# Patient Record
Sex: Female | Born: 1985 | Race: Black or African American | Hispanic: No | Marital: Single | Smoking: Never smoker
Health system: Southern US, Community
[De-identification: ages and names within clinical notes are randomized; demographics above are authoritative.]

## PROBLEM LIST (undated history)

## (undated) ENCOUNTER — Inpatient Hospital Stay (HOSPITAL_COMMUNITY): Payer: Self-pay

## (undated) DIAGNOSIS — R87629 Unspecified abnormal cytological findings in specimens from vagina: Secondary | ICD-10-CM

## (undated) DIAGNOSIS — O139 Gestational [pregnancy-induced] hypertension without significant proteinuria, unspecified trimester: Secondary | ICD-10-CM

## (undated) DIAGNOSIS — G47419 Narcolepsy without cataplexy: Secondary | ICD-10-CM

## (undated) DIAGNOSIS — I10 Essential (primary) hypertension: Secondary | ICD-10-CM

## (undated) HISTORY — PX: TONSILLECTOMY: SUR1361

## (undated) HISTORY — DX: Essential (primary) hypertension: I10

## (undated) HISTORY — PX: DILATION AND CURETTAGE OF UTERUS: SHX78

---
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
## 2005-02-18 ENCOUNTER — Other Ambulatory Visit: Admission: RE | Admit: 2005-02-18 | Discharge: 2005-02-18 | Payer: Self-pay | Admitting: Obstetrics and Gynecology

## 2005-11-09 ENCOUNTER — Emergency Department (HOSPITAL_COMMUNITY): Admission: EM | Admit: 2005-11-09 | Discharge: 2005-11-09 | Payer: Self-pay | Admitting: Emergency Medicine

## 2006-11-30 ENCOUNTER — Inpatient Hospital Stay (HOSPITAL_COMMUNITY): Admission: AD | Admit: 2006-11-30 | Discharge: 2006-11-30 | Payer: Self-pay | Admitting: Family Medicine

## 2009-09-21 ENCOUNTER — Inpatient Hospital Stay (HOSPITAL_COMMUNITY): Admission: AD | Admit: 2009-09-21 | Discharge: 2009-09-22 | Payer: Self-pay | Admitting: Obstetrics & Gynecology

## 2009-11-13 ENCOUNTER — Ambulatory Visit: Payer: Self-pay | Admitting: Pulmonary Disease

## 2009-11-13 DIAGNOSIS — G4719 Other hypersomnia: Secondary | ICD-10-CM | POA: Insufficient documentation

## 2009-11-21 ENCOUNTER — Telehealth (INDEPENDENT_AMBULATORY_CARE_PROVIDER_SITE_OTHER): Payer: Self-pay | Admitting: *Deleted

## 2010-01-06 ENCOUNTER — Ambulatory Visit (HOSPITAL_BASED_OUTPATIENT_CLINIC_OR_DEPARTMENT_OTHER): Admission: RE | Admit: 2010-01-06 | Discharge: 2010-01-06 | Payer: Self-pay | Admitting: Pulmonary Disease

## 2010-01-06 ENCOUNTER — Encounter: Payer: Self-pay | Admitting: Pulmonary Disease

## 2010-01-07 ENCOUNTER — Encounter: Payer: Self-pay | Admitting: Pulmonary Disease

## 2010-01-16 ENCOUNTER — Ambulatory Visit: Payer: Self-pay | Admitting: Pulmonary Disease

## 2010-01-27 ENCOUNTER — Ambulatory Visit: Payer: Self-pay | Admitting: Pulmonary Disease

## 2010-03-06 ENCOUNTER — Encounter: Payer: Self-pay | Admitting: Pulmonary Disease
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
## 2010-07-01 NOTE — Assessment & Plan Note (Signed)
Summary: ov to review results of npsg/mslt.   Copy to:  self- referral Primary Provider/Referring Provider:  None  CC:  Pt is here for a f/u appt to discuss sleep study results.  Pt denied any complaints/concerns. .  History of Present Illness: The pt comes in today for f/u of her recent NPSG and MSLT as part of a w/u for hypersomnia.  She was found to have no significant sleep disordered breathing, no arrhythmias or leg movements, but did have large numbers of nonspecific arousals.  There was no explanation for her hypersomnia on the basis of her sleep study.  She then underwent a MSLT, where she had a mean sleep onset latency of 10.2 min, and no SOREM's were noted.  She did not bring her sleep diaries to the sleep study.  I have gone over the studies with her in detail, and answered all of her questions.    Current Medications (verified): 1)  Tylenol 325 Mg Tabs (Acetaminophen) .... As Needed 2)  Prenatal Vitamins .... Take 1 Tablet By Mouth Once A Day 3)  Doxycycline Hyclate 100 Mg Caps (Doxycycline Hyclate) .... Take 1 Tablet By Mouth Two Times A Day For Acne 4)  Nuvaring 0.12-0.015 Mg/24hr Ring (Etonogestrel-Ethinyl Estradiol) .... Use As Directed  Allergies (verified): 1)  ! Penicillin  Review of Systems  The patient denies shortness of breath with activity, shortness of breath at rest, productive cough, non-productive cough, coughing up blood, chest pain, irregular heartbeats, acid heartburn, indigestion, loss of appetite, weight change, abdominal pain, difficulty swallowing, sore throat, tooth/dental problems, headaches, nasal congestion/difficulty breathing through nose, sneezing, itching, ear ache, anxiety, depression, hand/feet swelling, joint stiffness or pain, rash, change in color of mucus, and fever.    Vital Signs:  Patient profile:   25 year old female Height:      66 inches Weight:      162.38 pounds BMI:     26.30 O2 Sat:      99 % on Room air Temp:     98.5 degrees  F oral Pulse rate:   75 / minute BP sitting:   128 / 82  (left arm) Cuff size:   regular  Vitals Entered By: Arman Filter LPN (January 27, 2010 11:23 AM)  O2 Flow:  Room air CC: Pt is here for a f/u appt to discuss sleep study results.  Pt denied any complaints/concerns.  Comments Medications reviewed with patient Arman Filter LPN  January 27, 2010 11:26 AM    Physical Exam  General:  wd female in nad Extremities:  no edema or cyanosis  Neurologic:  alert, oriented, moves all 4.   Impression & Recommendations:  Problem # 1:  HYPERSOMNIA (ICD-780.54) the pt was found to have a normal NPSG other than large numbers of nonspecific arousals with no obvious cause, and her MSLT was essentially normal as well.  I really have no explanation for her daytime sleepiness other than to wonder whether this is subjective, and more an issue with fatigue or tiredness than true sleepiness.  Will check her thyroid tests to make sure this isn't an issue, but if ok, I would recommend working on weight loss and good sleep hygiene for the next few months to see if things improve.  The pt is not overly satisfied with this, and would like to consider further w/u.  I have offered to refer to neuro/ sleep specialist to see if there may be another explanation for her symptoms.  Medications Added to Medication  List This Visit: 1)  Nuvaring 0.12-0.015 Mg/24hr Ring (Etonogestrel-ethinyl estradiol) .... Use as directed  Other Orders: Est. Patient Level III (52841) TLB-TSH (Thyroid Stimulating Hormone) (32440-NUU)  Patient Instructions: 1)  will check thyroid tests to see if this may be causing your subjective sleepiness.  If these are normal, would focus on getting adequate sleep, and good sleep hygiene.   2)  Can consider a neurology evaluation if sleepiness continues.

## 2010-07-01 NOTE — Progress Notes (Signed)
Summary: sleep study referral-LMTCB x1  Phone Note Call from Patient Call back at Home Phone [phone]   Caller: Patient Call For: clance Reason for Call: Talk to Nurse Summary of Call: Needs to be referred to an outside lab that does sleep studies on the weekend. Initial call taken by: Darletta Moll,  November 21, 2009 11:35 AM  Follow-up for Phone Call        dr clance pls advise   Philipp Deputy [facility]  November 21, 2009 11:50 AM   Additional Follow-up for Phone Call Additional follow up Details #1::        no outside sleep lab does the mslt (daytime sleep study) on weekends. She may have a complicated sleep problem that will require me having access to all of the data.  I would prefer she have her sleep testing where I can see the results. Additional Follow-up by: Barbaraann Share MD,  November 21, 2009 1:39 PM    Additional Follow-up for Phone Call Additional follow up Details #2::    [facility] Gweneth Dimitri RN  November 21, 2009 2:15 PM  Spoke with pt and advised of the above recs per [facility].  Pt verbalized understanding and states that she will keep her mslt appt that we already set up for her on 7/25 at 8 pm. Follow-up by: Vernie Murders,  November 22, 2009 11:34 AM

## 2010-07-01 NOTE — Letter (Signed)
Summary: [facility]  [facility]   Imported By: Sherian Rein 04/07/2010 09:50:45  _____________________________________________________________________  External Attachment:    Type:   Image     Comment:   External Document

## 2010-08-19 LAB — CBC
HCT: 35.2 % — ABNORMAL LOW (ref 36.0–46.0)
Hemoglobin: 11.7 g/dL — ABNORMAL LOW (ref 12.0–15.0)
MCHC: 33.2 g/dL (ref 30.0–36.0)
RDW: 15.3 % (ref 11.5–15.5)
WBC: 8.7 10*3/uL (ref 4.0–10.5)

## 2011-01-28 ENCOUNTER — Emergency Department (HOSPITAL_COMMUNITY)
Admission: EM | Admit: 2011-01-28 | Discharge: 2011-01-28 | Disposition: A | Discharge status: Home / Self Care | Payer: 59 | Attending: Emergency Medicine | Admitting: Emergency Medicine

## 2011-01-28 ENCOUNTER — Encounter (HOSPITAL_COMMUNITY): Payer: Self-pay

## 2011-01-28 ENCOUNTER — Emergency Department (HOSPITAL_COMMUNITY): Payer: 59

## 2011-01-28 DIAGNOSIS — E860 Dehydration: Secondary | ICD-10-CM | POA: Insufficient documentation

## 2011-01-28 DIAGNOSIS — R197 Diarrhea, unspecified: Secondary | ICD-10-CM | POA: Insufficient documentation

## 2011-01-28 DIAGNOSIS — R630 Anorexia: Secondary | ICD-10-CM | POA: Insufficient documentation

## 2011-01-28 DIAGNOSIS — N898 Other specified noninflammatory disorders of vagina: Secondary | ICD-10-CM | POA: Insufficient documentation

## 2011-01-28 DIAGNOSIS — R112 Nausea with vomiting, unspecified: Secondary | ICD-10-CM | POA: Insufficient documentation

## 2011-01-28 DIAGNOSIS — R109 Unspecified abdominal pain: Secondary | ICD-10-CM | POA: Insufficient documentation

## 2011-01-28 LAB — WET PREP, GENITAL
Clue Cells Wet Prep HPF POC: NONE SEEN
Trich, Wet Prep: NONE SEEN
Yeast Wet Prep HPF POC: NONE SEEN

## 2011-01-28 LAB — URINALYSIS, ROUTINE W REFLEX MICROSCOPIC
Leukocytes, UA: NEGATIVE
Protein, ur: NEGATIVE mg/dL

## 2011-01-28 LAB — DIFFERENTIAL
Basophils Absolute: 0 10*3/uL (ref 0.0–0.1)
Basophils Relative: 0 % (ref 0–1)
Eosinophils Absolute: 0 10*3/uL (ref 0.0–0.7)
Eosinophils Relative: 0 % (ref 0–5)
Monocytes Absolute: 0.4 10*3/uL (ref 0.1–1.0)
Monocytes Relative: 6 % (ref 3–12)
Neutro Abs: 4.4 10*3/uL (ref 1.7–7.7)

## 2011-01-28 LAB — CBC
MCHC: 36.3 g/dL — ABNORMAL HIGH (ref 30.0–36.0)
RBC: 4.39 MIL/uL (ref 3.87–5.11)
RDW: 12.6 % (ref 11.5–15.5)

## 2011-01-28 LAB — POCT I-STAT, CHEM 8
BUN: 6 mg/dL (ref 6–23)
Chloride: 101 mEq/L (ref 96–112)
Glucose, Bld: 85 mg/dL (ref 70–99)
Potassium: 3.6 mEq/L (ref 3.5–5.1)
Sodium: 136 mEq/L (ref 135–145)

## 2011-01-28 LAB — URINE MICROSCOPIC-ADD ON

## 2011-01-28 MED ORDER — IOHEXOL 300 MG/ML  SOLN
100.0000 mL | Freq: Once | INTRAMUSCULAR | Status: AC | PRN
  Administered 2011-01-28: 100 mL via INTRAVENOUS

## 2011-01-29 ENCOUNTER — Emergency Department (HOSPITAL_COMMUNITY)
Admission: EM | Admit: 2011-01-29 | Discharge: 2011-01-30 | Disposition: A | Discharge status: Home / Self Care | Payer: 59 | Attending: Emergency Medicine | Admitting: Emergency Medicine

## 2011-01-29 DIAGNOSIS — R109 Unspecified abdominal pain: Secondary | ICD-10-CM | POA: Insufficient documentation

## 2011-01-29 DIAGNOSIS — R6883 Chills (without fever): Secondary | ICD-10-CM | POA: Insufficient documentation

## 2011-01-29 DIAGNOSIS — R112 Nausea with vomiting, unspecified: Secondary | ICD-10-CM | POA: Insufficient documentation

## 2011-01-29 DIAGNOSIS — R630 Anorexia: Secondary | ICD-10-CM | POA: Insufficient documentation

## 2011-01-29 DIAGNOSIS — R10819 Abdominal tenderness, unspecified site: Secondary | ICD-10-CM | POA: Insufficient documentation

## 2011-01-29 DIAGNOSIS — E860 Dehydration: Secondary | ICD-10-CM | POA: Insufficient documentation

## 2011-01-29 DIAGNOSIS — Z79899 Other long term (current) drug therapy: Secondary | ICD-10-CM | POA: Insufficient documentation

## 2011-01-29 DIAGNOSIS — R5381 Other malaise: Secondary | ICD-10-CM | POA: Insufficient documentation

## 2011-01-29 DIAGNOSIS — R42 Dizziness and giddiness: Secondary | ICD-10-CM | POA: Insufficient documentation

## 2011-01-29 LAB — GC/CHLAMYDIA PROBE AMP, GENITAL
Chlamydia, DNA Probe: NEGATIVE
GC Probe Amp, Genital: NEGATIVE

## 2011-01-29 LAB — URINE CULTURE
Colony Count: 2000
Culture  Setup Time: 201208291415

## 2011-01-30 ENCOUNTER — Emergency Department (HOSPITAL_COMMUNITY): Payer: 59

## 2011-01-30 LAB — URINALYSIS, ROUTINE W REFLEX MICROSCOPIC
Glucose, UA: NEGATIVE mg/dL
Hgb urine dipstick: NEGATIVE
Ketones, ur: >80 mg/dL — AB
Leukocytes, UA: NEGATIVE
Nitrite: NEGATIVE
Protein, ur: NEGATIVE mg/dL
Specific Gravity, Urine: 1.024 (ref 1.005–1.030)
Urobilinogen, UA: 1 mg/dL (ref 0.0–1.0)
pH: 6.5 (ref 5.0–8.0)

## 2011-01-30 LAB — CBC
HCT: 39.2 % (ref 36.0–46.0)
Hemoglobin: 13.9 g/dL (ref 12.0–15.0)
MCH: 31.2 pg (ref 26.0–34.0)
MCHC: 35.5 g/dL (ref 30.0–36.0)
MCV: 88.1 fL (ref 78.0–100.0)
Platelets: 227 10*3/uL (ref 150–400)
RBC: 4.45 MIL/uL (ref 3.87–5.11)
RDW: 12.9 % (ref 11.5–15.5)
WBC: 6.8 10*3/uL (ref 4.0–10.5)

## 2011-01-30 LAB — DIFFERENTIAL
Basophils Absolute: 0 10*3/uL (ref 0.0–0.1)
Basophils Relative: 0 % (ref 0–1)
Eosinophils Absolute: 0 10*3/uL (ref 0.0–0.7)
Eosinophils Relative: 0 % (ref 0–5)
Lymphocytes Relative: 30 % (ref 12–46)
Lymphs Abs: 2 10*3/uL (ref 0.7–4.0)
Monocytes Absolute: 0.5 10*3/uL (ref 0.1–1.0)
Monocytes Relative: 7 % (ref 3–12)
Neutro Abs: 4.3 10*3/uL (ref 1.7–7.7)
Neutrophils Relative %: 62 % (ref 43–77)

## 2011-01-30 LAB — COMPREHENSIVE METABOLIC PANEL
ALT: 12 U/L (ref 0–35)
AST: 25 U/L (ref 0–37)
Albumin: 4 g/dL (ref 3.5–5.2)
Alkaline Phosphatase: 78 U/L (ref 39–117)
BUN: 7 mg/dL (ref 6–23)
CO2: 24 mEq/L (ref 19–32)
Calcium: 8.6 mg/dL (ref 8.4–10.5)
Chloride: 102 mEq/L (ref 96–112)
Creatinine, Ser: 0.77 mg/dL (ref 0.50–1.10)
GFR calc Af Amer: >60 mL/min (ref >60)
GFR calc non Af Amer: >60 mL/min (ref >60)
Glucose, Bld: 88 mg/dL (ref 70–99)
Potassium: 3.9 mEq/L (ref 3.5–5.1)
Sodium: 138 mEq/L (ref 135–145)
Total Bilirubin: 0.4 mg/dL (ref 0.3–1.2)
Total Protein: 7.1 g/dL (ref 6.0–8.3)

## 2011-01-30 LAB — LIPASE, BLOOD: Lipase: 17 U/L (ref 11–59)

## 2011-03-17 LAB — CBC
Hemoglobin: 12.5
MCHC: 33.7
MCV: 89.3
RBC: 4.15
WBC: 7.1

## 2011-03-17 LAB — URINALYSIS, ROUTINE W REFLEX MICROSCOPIC
Leukocytes, UA: NEGATIVE
Nitrite: NEGATIVE
Protein, ur: 30 — AB
Specific Gravity, Urine: >1.03 — ABNORMAL HIGH
Urobilinogen, UA: 1

## 2011-03-17 LAB — HCG, QUANTITATIVE, PREGNANCY: hCG, Beta Chain, Quant, S: 26026 — ABNORMAL HIGH

## 2011-03-17 LAB — WET PREP, GENITAL
Clue Cells Wet Prep HPF POC: NONE SEEN
Trich, Wet Prep: NONE SEEN
Yeast Wet Prep HPF POC: NONE SEEN

## 2011-03-17 LAB — URINE MICROSCOPIC-ADD ON

## 2011-08-26 ENCOUNTER — Emergency Department (HOSPITAL_COMMUNITY)
Admission: EM | Admit: 2011-08-26 | Discharge: 2011-08-26 | Disposition: A | Discharge status: Home / Self Care | Payer: 59 | Attending: Emergency Medicine | Admitting: Emergency Medicine

## 2011-08-26 ENCOUNTER — Emergency Department (HOSPITAL_COMMUNITY): Payer: 59

## 2011-08-26 ENCOUNTER — Encounter (HOSPITAL_COMMUNITY): Payer: Self-pay

## 2011-08-26 DIAGNOSIS — Y92410 Unspecified street and highway as the place of occurrence of the external cause: Secondary | ICD-10-CM | POA: Insufficient documentation

## 2011-08-26 DIAGNOSIS — S161XXA Strain of muscle, fascia and tendon at neck level, initial encounter: Secondary | ICD-10-CM

## 2011-08-26 DIAGNOSIS — S139XXA Sprain of joints and ligaments of unspecified parts of neck, initial encounter: Secondary | ICD-10-CM | POA: Insufficient documentation

## 2011-08-26 MED ORDER — HYDROCODONE-ACETAMINOPHEN 5-500 MG PO TABS: 1.0000 | ORAL_TABLET | Freq: Four times a day (QID) | ORAL | Status: AC | PRN

## 2011-08-26 MED ORDER — CYCLOBENZAPRINE HCL 10 MG PO TABS: 10.0000 mg | ORAL_TABLET | Freq: Two times a day (BID) | ORAL | Status: AC | PRN

## 2011-08-26 MED ORDER — ACETAMINOPHEN 325 MG PO TABS
650.0000 mg | ORAL_TABLET | Freq: Once | ORAL | Status: AC
  Administered 2011-08-26: 650 mg via ORAL
  Filled 2011-08-26: qty 2

## 2011-08-26 MED ORDER — IBUPROFEN 600 MG PO TABS: 600.0000 mg | ORAL_TABLET | Freq: Four times a day (QID) | ORAL | Status: AC | PRN

## 2011-08-26 NOTE — ED Provider Notes (Signed)
Medical screening examination/treatment/procedure(s) were performed by non-physician practitioner and as supervising physician I was immediately available for consultation/collaboration.   Lyanne Co, MD 08/26/11 [phone]
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
## 2011-08-26 NOTE — ED Notes (Signed)
Assist PA with holding C-spine
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
## 2012-05-09 ENCOUNTER — Inpatient Hospital Stay (HOSPITAL_COMMUNITY): Payer: 59

## 2012-05-09 ENCOUNTER — Inpatient Hospital Stay (HOSPITAL_COMMUNITY)
Admission: AD | Admit: 2012-05-09 | Discharge: 2012-05-09 | Disposition: A | Discharge status: Home / Self Care | Payer: 59 | Source: Ambulatory Visit | Attending: Obstetrics and Gynecology | Admitting: Obstetrics and Gynecology

## 2012-05-09 ENCOUNTER — Encounter (HOSPITAL_COMMUNITY): Payer: Self-pay | Admitting: *Deleted

## 2012-05-09 DIAGNOSIS — O200 Threatened abortion: Secondary | ICD-10-CM

## 2012-05-09 DIAGNOSIS — O26859 Spotting complicating pregnancy, unspecified trimester: Secondary | ICD-10-CM | POA: Insufficient documentation

## 2012-05-09 DIAGNOSIS — R109 Unspecified abdominal pain: Secondary | ICD-10-CM | POA: Insufficient documentation

## 2012-05-09 DIAGNOSIS — O210 Mild hyperemesis gravidarum: Secondary | ICD-10-CM | POA: Insufficient documentation

## 2012-05-09 LAB — CBC WITH DIFFERENTIAL/PLATELET
Basophils Absolute: 0 10*3/uL (ref 0.0–0.1)
Lymphocytes Relative: 43 % (ref 12–46)
Lymphs Abs: 3.2 10*3/uL (ref 0.7–4.0)
Neutro Abs: 3.5 10*3/uL (ref 1.7–7.7)
Platelets: 202 10*3/uL (ref 150–400)
RBC: 4.36 MIL/uL (ref 3.87–5.11)
RDW: 12.6 % (ref 11.5–15.5)
WBC: 7.4 10*3/uL (ref 4.0–10.5)

## 2012-05-09 LAB — URINE MICROSCOPIC-ADD ON

## 2012-05-09 LAB — URINALYSIS, ROUTINE W REFLEX MICROSCOPIC
Glucose, UA: NEGATIVE mg/dL
Ketones, ur: 15 mg/dL — AB
Leukocytes, UA: NEGATIVE
Protein, ur: NEGATIVE mg/dL
Urobilinogen, UA: 0.2 mg/dL (ref 0.0–1.0)

## 2012-05-09 LAB — WET PREP, GENITAL
Trich, Wet Prep: NONE SEEN
Yeast Wet Prep HPF POC: NONE SEEN

## 2012-05-09 LAB — HCG, QUANTITATIVE, PREGNANCY: hCG, Beta Chain, Quant, S: 27041 m[IU]/mL — ABNORMAL HIGH (ref <5)

## 2012-05-09 MED ORDER — ONDANSETRON 8 MG PO TBDP
8.0000 mg | ORAL_TABLET | Freq: Once | ORAL | Status: AC
  Administered 2012-05-09: 8 mg via ORAL
  Filled 2012-05-09: qty 1

## 2012-05-09 MED ORDER — IBUPROFEN 600 MG PO TABS
600.0000 mg | ORAL_TABLET | Freq: Once | ORAL | Status: AC
  Administered 2012-05-09: 600 mg via ORAL
  Filled 2012-05-09: qty 1

## 2012-05-09 MED ORDER — ONDANSETRON HCL 4 MG PO TABS: 8.0000 mg | ORAL_TABLET | Freq: Two times a day (BID) | ORAL | Status: DC | PRN

## 2012-05-09 NOTE — MAU Note (Signed)
Pt reports she has been having lower abd pain x 4 days, worsening. Mostly middle and left side. Positive preg test at dermatologist office due to medication regimen. LMP 04/02/2012, some blood on tissue today

## 2012-05-09 NOTE — Progress Notes (Signed)
Kerrie Buffalo NP in earlier to discuss lab and u/s results and to discuss plan of care. Written and verbal d/c instructions given and understanding voiced.

## 2012-05-09 NOTE — Progress Notes (Signed)
Only sees spotting when wipes

## 2012-05-09 NOTE — MAU Provider Note (Signed)
History     CSN: [id number]  Arrival date and time: 05/09/12 1947   First Provider Initiated Contact with Patient 05/09/12 2125      Chief Complaint  Patient presents with  . Abdominal Pain   HPI Rhonda Bird is a 26 y.o. female early pregnant who presents to MAU with abdominal pain. The pain started 3 days ago as lower abdominal cramping. Today the pain increased on the left side and she noted spotting. She describes the pain as constant in the lower left abdomen.   OB History    Grav Para Term Preterm Abortions TAB SAB Ect Mult Living   4 1 1  2 2           No past medical history on file.  Past Surgical History  Procedure Date  . Tonsillectomy   . Dilation and curettage of uterus     Family History  Problem Relation Age of Onset  . Other Neg Hx     History  Substance Use Topics  . Smoking status: Former Games developer  . Smokeless tobacco: Not on file  . Alcohol Use: No     Comment: socially    Allergies:  Allergies  Allergen Reactions  . Penicillins Hives    Prescriptions prior to admission  Medication Sig Dispense Refill  . acetaminophen (TYLENOL) 500 MG tablet Take 1,000 mg by mouth every 6 (six) hours as needed. pain      . [DISCONTINUED] ISOtretinoin (ACCUTANE) 30 MG capsule Take 60 mg by mouth daily. Pt d/c'd this Rx        Review of Systems  Constitutional: Negative for fever and chills.  Eyes: Negative for blurred vision and double vision.  Respiratory: Negative for cough and wheezing.   Cardiovascular: Negative for chest pain and palpitations.  Gastrointestinal: Positive for nausea and abdominal pain.  Genitourinary: Positive for frequency. Negative for dysuria and urgency.       Vaginal bleeding, spotting.  Musculoskeletal: Positive for back pain.  Skin: Negative for rash.  Neurological: Negative for dizziness, seizures and headaches.  Endo/Heme/Allergies: Does not bruise/bleed easily.  Psychiatric/Behavioral: Negative for depression.  The patient is not nervous/anxious.   Blood pressure 127/79, pulse 68, temperature 98.5 F (36.9 C), temperature source Oral, resp. rate 20, height 5\' 6"  (1.676 m), weight 155 lb (70.308 kg), last menstrual period 04/02/2012, SpO2 100.00%.  Physical Exam  Nursing note and vitals reviewed. Constitutional: She is oriented to person, place, and time. She appears well-developed and well-nourished. No distress.  HENT:  Head: Normocephalic and atraumatic.  Eyes: EOM are normal.  Neck: Neck supple.  Cardiovascular: Normal rate.   Respiratory: Effort normal.  GI: Soft. There is tenderness in the left lower quadrant. There is no rigidity, no rebound, no guarding and no CVA tenderness.  Genitourinary:       External genitalia without lesions. Scant blood vaginal vault. Cervix closed, long. Left adnexal tenderness. Uterus slightly enlarged.  Musculoskeletal: Normal range of motion.  Neurological: She is alert and oriented to person, place, and time.  Skin: Skin is warm and dry.  Psychiatric: She has a normal mood and affect. Her behavior is normal. Judgment and thought content normal.   Results for orders placed during the hospital encounter of 05/09/12 (from the past 24 hour(s))  URINALYSIS, ROUTINE W REFLEX MICROSCOPIC     Status: Abnormal   Collection Time   05/09/12  8:18 PM      Component Value Range   Color, Urine YELLOW  YELLOW  APPearance CLEAR  CLEAR   Specific Gravity, Urine 1.020  1.005 - 1.030   pH 6.0  5.0 - 8.0   Glucose, UA NEGATIVE  NEGATIVE mg/dL   Hgb urine dipstick SMALL (*) NEGATIVE   Bilirubin Urine NEGATIVE  NEGATIVE   Ketones, ur 15 (*) NEGATIVE mg/dL   Protein, ur NEGATIVE  NEGATIVE mg/dL   Urobilinogen, UA 0.2  0.0 - 1.0 mg/dL   Nitrite NEGATIVE  NEGATIVE   Leukocytes, UA NEGATIVE  NEGATIVE  URINE MICROSCOPIC-ADD ON     Status: Abnormal   Collection Time   05/09/12  8:18 PM      Component Value Range   Squamous Epithelial / LPF FEW (*) RARE   WBC, UA 0-2  <3  WBC/hpf   RBC / HPF 0-2  <3 RBC/hpf   Bacteria, UA RARE  RARE   Urine-Other MUCOUS PRESENT    POCT PREGNANCY, URINE     Status: Abnormal   Collection Time   05/09/12  8:24 PM      Component Value Range   Preg Test, Ur POSITIVE (*) NEGATIVE  CBC WITH DIFFERENTIAL     Status: Normal   Collection Time   05/09/12  8:33 PM      Component Value Range   WBC 7.4  4.0 - 10.5 K/uL   RBC 4.36  3.87 - 5.11 MIL/uL   Hemoglobin 13.5  12.0 - 15.0 g/dL   HCT 16.1  09.6 - 04.5 %   MCV 88.1  78.0 - 100.0 fL   MCH 31.0  26.0 - 34.0 pg   MCHC 35.2  30.0 - 36.0 g/dL   RDW 40.9  81.1 - 91.4 %   Platelets 202  150 - 400 K/uL   Neutrophils Relative 48  43 - 77 %   Neutro Abs 3.5  1.7 - 7.7 K/uL   Lymphocytes Relative 43  12 - 46 %   Lymphs Abs 3.2  0.7 - 4.0 K/uL   Monocytes Relative 8  3 - 12 %   Monocytes Absolute 0.6  0.1 - 1.0 K/uL   Eosinophils Relative 0  0 - 5 %   Eosinophils Absolute 0.0  0.0 - 0.7 K/uL   Basophils Relative 0  0 - 1 %   Basophils Absolute 0.0  0.0 - 0.1 K/uL  HCG, QUANTITATIVE, PREGNANCY     Status: Abnormal   Collection Time   05/09/12  8:33 PM      Component Value Range   hCG, Beta Chain, [place] Longest 270[address] (*) <5 mIU/mL  ABO/RH     Status: Normal (Preliminary result)   Collection Time   05/09/12  8:33 PM      Component Value Range   ABO/RH(D) B POS    WET PREP, GENITAL     Status: Abnormal   Collection Time   05/09/12  9:23 PM      Component Value Range   Yeast Wet Prep HPF POC NONE SEEN  NONE SEEN   Trich, Wet Prep NONE SEEN  NONE SEEN   Clue Cells Wet Prep HPF POC FEW (*) NONE SEEN   WBC, Wet Prep HPF POC FEW (*) NONE SEEN   US Ob Comp Less 14 Wks  05/09/2012  *RADIOLOGY REPORT*  Clinical Data: Left lower quadrant pain and spotting.  Estimated gestational age by LMP is 5 weeks 2 days.  Quantitative beta HCG 27,041  OBSTETRIC <14 WK ULTRASOUND  Technique:  Transabdominal ultrasound was performed for evaluation of the gestation  as well as the maternal uterus and adnexal  regions.  Comparison:  None.  Intrauterine gestational sac: There is a single intrauterine gestation. A small crescent of low attenuation adjacent to the gestational sac suggesting a tiny subchorionic hemorrhage. Yolk sac: Yolk sac is visualized. Embryo: Fetal pole is visualized. Cardiac Activity: Fetal cardiac activity is visualized. Heart Rate: 60 bpm  CRL:  3.1 mm  6 w  0 d        [place] EDC: 01/02/2013  Maternal uterus/Adnexae: The uterus is anteverted.  No myometrial masses are demonstrated. Both ovaries are visualized and appear symmetrical.  No abnormal adnexal masses.  No free pelvic fluid collections identified.  IMPRESSION: Single intrauterine gestational sac.  Estimated gestational age by crown-rump length is 6 weeks 0 days.  Possible minimal subchorionic hemorrhage.   Original Report Authenticated By: Burman Nieves, M.D.    Assessment: 26 y.o. female @ [redacted] weeks gestation with abdominal pain and spotting   Tiny SCH   Threatened AB, concern re: heart rate of 60 bpm   Nausea in early pregnancy  Plan:  Discussed with Dr. Marcelle Overlie    Rx Zofran   Discussed with patient use of ibuprofen in early pregnancy but limit for a couple days   Follow up in the office, will plan to repeat ultrasound in one week   Return here as needed Procedures I have reviewed this patient's vital signs, nurses notes, appropriate labs and imaging. I have discussed results with the patient and she voices understanding.    Medication List     As of 05/09/2012 10:46 PM    START taking these medications         ondansetron 4 MG tablet   Commonly known as: ZOFRAN   Take 2 tablets (8 mg total) by mouth every 12 (twelve) hours as needed for nausea.      CONTINUE taking these medications         acetaminophen 500 MG tablet   Commonly known as: TYLENOL      STOP taking these medications         ISOtretinoin 30 MG capsule   Commonly known as: ACCUTANE          Where to get your medications    These are the  prescriptions that you need to pick up. We sent them to a specific pharmacy, so you will need to go there to get them.   [facility] - [place], [place] - [address]    [address] [place] [address]    Phone: [phone]        ondansetron 4 MG tablet            Deckard Stuber, RN, FNP, [facility] 05/09/2012, 10:45 PM

## 2012-05-10 LAB — ABO/RH: ABO/RH(D): B POS

## 2012-11-22 LAB — OB RESULTS CONSOLE RPR: RPR: NONREACTIVE

## 2012-11-22 LAB — OB RESULTS CONSOLE ANTIBODY SCREEN: Antibody Screen: NEGATIVE

## 2012-11-22 LAB — OB RESULTS CONSOLE HIV ANTIBODY (ROUTINE TESTING): HIV: NONREACTIVE

## 2012-11-22 LAB — OB RESULTS CONSOLE HEPATITIS B SURFACE ANTIGEN: HEP B S AG: NEGATIVE

## 2012-11-22 LAB — OB RESULTS CONSOLE RUBELLA ANTIBODY, IGM: Rubella: IMMUNE

## 2012-12-06 LAB — OB RESULTS CONSOLE GC/CHLAMYDIA
Chlamydia: NEGATIVE
Gonorrhea: NEGATIVE

## 2013-03-14 ENCOUNTER — Encounter (HOSPITAL_COMMUNITY): Payer: Self-pay | Admitting: *Deleted

## 2013-05-07 ENCOUNTER — Inpatient Hospital Stay (HOSPITAL_COMMUNITY)
Admission: EM | Admit: 2013-05-07 | Discharge: 2013-05-09 | DRG: 778 | Disposition: A | Discharge status: Home / Self Care | Payer: 59 | Attending: Obstetrics and Gynecology | Admitting: Obstetrics and Gynecology

## 2013-05-07 ENCOUNTER — Encounter (HOSPITAL_COMMUNITY): Payer: Self-pay | Admitting: Emergency Medicine

## 2013-05-07 DIAGNOSIS — M5380 Other specified dorsopathies, site unspecified: Secondary | ICD-10-CM | POA: Diagnosis present

## 2013-05-07 DIAGNOSIS — G47419 Narcolepsy without cataplexy: Secondary | ICD-10-CM | POA: Diagnosis present

## 2013-05-07 DIAGNOSIS — O4703 False labor before 37 completed weeks of gestation, third trimester: Secondary | ICD-10-CM

## 2013-05-07 DIAGNOSIS — M545 Low back pain, unspecified: Secondary | ICD-10-CM | POA: Diagnosis present

## 2013-05-07 DIAGNOSIS — Y92410 Unspecified street and highway as the place of occurrence of the external cause: Secondary | ICD-10-CM

## 2013-05-07 DIAGNOSIS — O4700 False labor before 37 completed weeks of gestation, unspecified trimester: Principal | ICD-10-CM | POA: Diagnosis present

## 2013-05-07 DIAGNOSIS — O99891 Other specified diseases and conditions complicating pregnancy: Secondary | ICD-10-CM | POA: Diagnosis present

## 2013-05-07 DIAGNOSIS — O479 False labor, unspecified: Secondary | ICD-10-CM | POA: Diagnosis present

## 2013-05-07 LAB — BASIC METABOLIC PANEL
BUN: 5 mg/dL — ABNORMAL LOW (ref 6–23)
CO2: 21 mEq/L (ref 19–32)
Calcium: 8.9 mg/dL (ref 8.4–10.5)
Creatinine, Ser: 0.53 mg/dL (ref 0.50–1.10)
GFR calc non Af Amer: >90 mL/min (ref >90)
Glucose, Bld: 92 mg/dL (ref 70–99)
Sodium: 133 mEq/L — ABNORMAL LOW (ref 135–145)

## 2013-05-07 LAB — CBC WITH DIFFERENTIAL/PLATELET
Basophils Absolute: 0 10*3/uL (ref 0.0–0.1)
Eosinophils Absolute: 0.1 10*3/uL (ref 0.0–0.7)
Eosinophils Relative: 1 % (ref 0–5)
HCT: 35.3 % — ABNORMAL LOW (ref 36.0–46.0)
Lymphocytes Relative: 27 % (ref 12–46)
Lymphs Abs: 2.9 10*3/uL (ref 0.7–4.0)
MCH: 31.9 pg (ref 26.0–34.0)
MCHC: 35.7 g/dL (ref 30.0–36.0)
MCV: 89.4 fL (ref 78.0–100.0)
Monocytes Absolute: 0.8 10*3/uL (ref 0.1–1.0)
Platelets: 226 10*3/uL (ref 150–400)
RBC: 3.95 MIL/uL (ref 3.87–5.11)
RDW: 13 % (ref 11.5–15.5)
WBC: 10.7 10*3/uL — ABNORMAL HIGH (ref 4.0–10.5)

## 2013-05-07 MED ORDER — LACTATED RINGERS IV BOLUS (SEPSIS)
1000.0000 mL | Freq: Once | INTRAVENOUS | Status: AC
  Administered 2013-05-07: 1000 mL via INTRAVENOUS
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
## 2013-05-07 NOTE — ED Notes (Signed)
No changes, OB RRT RN at [facility], pt remains on monitor and OB/fetal monitor, rates abd pain 6/10. VSS.
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
## 2013-05-07 NOTE — ED Notes (Signed)
No changes, preparing for EPIC downtime.
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
## 2013-05-07 NOTE — ED Notes (Signed)
OB Rapid Response RN at [facility], visitor at [facility]. Pt alert, NAD, calm, interactive, skin W&D, resps e/u, speaking in clear complete sentences.

## 2013-05-08 ENCOUNTER — Inpatient Hospital Stay (HOSPITAL_COMMUNITY): Payer: 59

## 2013-05-08 ENCOUNTER — Encounter (HOSPITAL_COMMUNITY): Payer: Self-pay | Admitting: *Deleted

## 2013-05-08 DIAGNOSIS — O479 False labor, unspecified: Secondary | ICD-10-CM | POA: Diagnosis present

## 2013-05-08 DIAGNOSIS — O99891 Other specified diseases and conditions complicating pregnancy: Secondary | ICD-10-CM | POA: Diagnosis present

## 2013-05-08 DIAGNOSIS — M545 Low back pain, unspecified: Secondary | ICD-10-CM | POA: Diagnosis present

## 2013-05-08 DIAGNOSIS — G47419 Narcolepsy without cataplexy: Secondary | ICD-10-CM | POA: Diagnosis present

## 2013-05-08 DIAGNOSIS — O4700 False labor before 37 completed weeks of gestation, unspecified trimester: Secondary | ICD-10-CM | POA: Diagnosis present

## 2013-05-08 DIAGNOSIS — M5380 Other specified dorsopathies, site unspecified: Secondary | ICD-10-CM | POA: Diagnosis present

## 2013-05-08 DIAGNOSIS — Y92410 Unspecified street and highway as the place of occurrence of the external cause: Secondary | ICD-10-CM | POA: Diagnosis not present

## 2013-05-08 LAB — FIBRINOGEN: Fibrinogen: 547 mg/dL — ABNORMAL HIGH (ref 204–475)

## 2013-05-08 LAB — CBC WITH DIFFERENTIAL/PLATELET
Basophils Absolute: 0 10*3/uL (ref 0.0–0.1)
HCT: 32.8 % — ABNORMAL LOW (ref 36.0–46.0)
Hemoglobin: 11.5 g/dL — ABNORMAL LOW (ref 12.0–15.0)
Lymphocytes Relative: 16 % (ref 12–46)
Lymphs Abs: 1.5 10*3/uL (ref 0.7–4.0)
MCV: 88.9 fL (ref 78.0–100.0)
Monocytes Absolute: 0.3 10*3/uL (ref 0.1–1.0)
Neutro Abs: 7.3 10*3/uL (ref 1.7–7.7)
Neutrophils Relative %: 81 % — ABNORMAL HIGH (ref 43–77)
Platelets: 199 10*3/uL (ref 150–400)
RDW: 13.2 % (ref 11.5–15.5)

## 2013-05-08 LAB — CBC
HCT: 30.6 % — ABNORMAL LOW (ref 36.0–46.0)
Hemoglobin: 10.9 g/dL — ABNORMAL LOW (ref 12.0–15.0)
MCH: 31.6 pg (ref 26.0–34.0)
MCHC: 35.6 g/dL (ref 30.0–36.0)
MCV: 88.7 fL (ref 78.0–100.0)
RDW: 13.2 % (ref 11.5–15.5)

## 2013-05-08 MED ORDER — LACTATED RINGERS IV SOLN
INTRAVENOUS | Status: DC
  Administered 2013-05-08: 04:00:00 via INTRAVENOUS

## 2013-05-08 MED ORDER — HYDROCODONE-ACETAMINOPHEN 5-325 MG PO TABS: 1.0000 | ORAL_TABLET | Freq: Four times a day (QID) | ORAL | Status: DC | PRN

## 2013-05-08 MED ORDER — ACETAMINOPHEN 325 MG PO TABS: 650.0000 mg | ORAL_TABLET | Freq: Three times a day (TID) | ORAL | Status: DC | PRN

## 2013-05-08 MED ORDER — BETAMETHASONE SOD PHOS & ACET 6 (3-3) MG/ML IJ SUSP
12.0000 mg | INTRAMUSCULAR | Status: AC
  Administered 2013-05-08 – 2013-05-09 (×2): 12 mg via INTRAMUSCULAR
  Filled 2013-05-08 (×2): qty 2
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
## 2013-05-08 NOTE — ED Notes (Signed)
carelink here at [facility] speaking with pt, receiving report from KLF, RN, no changes, visitor remains, VSS, present in room.
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
## 2013-05-08 NOTE — ED Notes (Addendum)
Pt alert, NAD, calm, interactive, resps e/u, speaking in clear complete sentences, pending arrival of [facility] for Transfer, pt signed EMTALA transfer consent form. Pt denies sx other than low back pain, (denies: nausea, HA, sob, dizziness or other sx), remains on cardiac and fetal monitor, OB RRT RN present.
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
## 2013-05-08 NOTE — Progress Notes (Signed)
Ur chart review completed.  
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
## 2013-05-09 MED ORDER — CYCLOBENZAPRINE HCL 10 MG PO TABS: 10.0000 mg | ORAL_TABLET | Freq: Three times a day (TID) | ORAL | Status: DC | PRN
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
## 2013-05-09 NOTE — Progress Notes (Signed)
Dc instructions reviewed instructed pt to call md if bleeding or leaking of fluid or any concerns for pregnancy or come back to hospital
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
## 2013-06-19 ENCOUNTER — Inpatient Hospital Stay (HOSPITAL_COMMUNITY)
Admission: AD | Admit: 2013-06-19 | Discharge: 2013-06-21 | DRG: 775 | Disposition: A | Discharge status: Home / Self Care | Payer: 59 | Source: Ambulatory Visit | Attending: Obstetrics & Gynecology | Admitting: Obstetrics & Gynecology

## 2013-06-19 ENCOUNTER — Encounter (HOSPITAL_COMMUNITY): Payer: 59 | Admitting: Anesthesiology

## 2013-06-19 ENCOUNTER — Encounter (HOSPITAL_COMMUNITY): Payer: Self-pay | Admitting: *Deleted

## 2013-06-19 ENCOUNTER — Inpatient Hospital Stay (HOSPITAL_COMMUNITY): Payer: 59 | Admitting: Anesthesiology

## 2013-06-19 DIAGNOSIS — G47419 Narcolepsy without cataplexy: Secondary | ICD-10-CM | POA: Diagnosis present

## 2013-06-19 DIAGNOSIS — O99892 Other specified diseases and conditions complicating childbirth: Secondary | ICD-10-CM | POA: Diagnosis present

## 2013-06-19 DIAGNOSIS — Z3490 Encounter for supervision of normal pregnancy, unspecified, unspecified trimester: Secondary | ICD-10-CM

## 2013-06-19 DIAGNOSIS — O4290 Premature rupture of membranes, unspecified as to length of time between rupture and onset of labor, unspecified weeks of gestation: Principal | ICD-10-CM | POA: Diagnosis present

## 2013-06-19 DIAGNOSIS — O9989 Other specified diseases and conditions complicating pregnancy, childbirth and the puerperium: Secondary | ICD-10-CM

## 2013-06-19 HISTORY — DX: Unspecified abnormal cytological findings in specimens from vagina: R87.629

## 2013-06-19 HISTORY — DX: Gestational (pregnancy-induced) hypertension without significant proteinuria, unspecified trimester: O13.9

## 2013-06-19 HISTORY — DX: Narcolepsy without cataplexy: G47.419

## 2013-06-19 LAB — CBC
HCT: 32.7 % — ABNORMAL LOW (ref 36.0–46.0)
HEMOGLOBIN: 11.5 g/dL — AB (ref 12.0–15.0)
MCH: 31 pg (ref 26.0–34.0)
MCHC: 35.2 g/dL (ref 30.0–36.0)
MCV: 88.1 fL (ref 78.0–100.0)
Platelets: 207 10*3/uL (ref 150–400)
RBC: 3.71 MIL/uL — ABNORMAL LOW (ref 3.87–5.11)
RDW: 13.7 % (ref 11.5–15.5)
WBC: 8.3 10*3/uL (ref 4.0–10.5)

## 2013-06-19 LAB — OB RESULTS CONSOLE GBS: GBS: NEGATIVE

## 2013-06-19 LAB — POCT FERN TEST: POCT Fern Test: NEGATIVE

## 2013-06-19 LAB — RPR: RPR Ser Ql: NONREACTIVE

## 2013-06-19 LAB — AMNISURE RUPTURE OF MEMBRANE (ROM) NOT AT ARMC: AMNISURE: POSITIVE

## 2013-06-19 MED ORDER — FLEET ENEMA 7-19 GM/118ML RE ENEM: 1.0000 | ENEMA | RECTAL | Status: DC | PRN

## 2013-06-19 MED ORDER — OXYTOCIN 40 UNITS IN LACTATED RINGERS INFUSION - SIMPLE MED
1.0000 m[IU]/min | INTRAVENOUS | Status: DC
  Administered 2013-06-19: 4 m[IU]/min via INTRAVENOUS
  Administered 2013-06-19: 2 m[IU]/min via INTRAVENOUS
  Administered 2013-06-19: 8 m[IU]/min via INTRAVENOUS
  Filled 2013-06-19: qty 1000

## 2013-06-19 MED ORDER — OXYTOCIN BOLUS FROM INFUSION
500.0000 mL | INTRAVENOUS | Status: DC
  Administered 2013-06-19: 500 mL via INTRAVENOUS

## 2013-06-19 MED ORDER — OXYTOCIN 40 UNITS IN LACTATED RINGERS INFUSION - SIMPLE MED: 62.5000 mL/h | INTRAVENOUS | Status: DC

## 2013-06-19 MED ORDER — FENTANYL 2.5 MCG/ML BUPIVACAINE 1/10 % EPIDURAL INFUSION (WH - ANES)
14.0000 mL/h | INTRAMUSCULAR | Status: DC | PRN
  Administered 2013-06-19: 14 mL/h via EPIDURAL
  Filled 2013-06-19: qty 125

## 2013-06-19 MED ORDER — BUPIVACAINE HCL (PF) 0.25 % IJ SOLN
INTRAMUSCULAR | Status: DC | PRN
  Administered 2013-06-19 (×2): 5 mL via EPIDURAL

## 2013-06-19 MED ORDER — TERBUTALINE SULFATE 1 MG/ML IJ SOLN: 0.2500 mg | Freq: Once | INTRAMUSCULAR | Status: AC | PRN

## 2013-06-19 MED ORDER — EPHEDRINE 5 MG/ML INJ
10.0000 mg | INTRAVENOUS | Status: DC | PRN
  Filled 2013-06-19: qty 2
  Filled 2013-06-19: qty 4

## 2013-06-19 MED ORDER — CITRIC ACID-SODIUM CITRATE 334-500 MG/5ML PO SOLN: 30.0000 mL | ORAL | Status: DC | PRN

## 2013-06-19 MED ORDER — LACTATED RINGERS IV SOLN: 500.0000 mL | INTRAVENOUS | Status: DC | PRN

## 2013-06-19 MED ORDER — EPHEDRINE 5 MG/ML INJ
10.0000 mg | INTRAVENOUS | Status: DC | PRN
  Filled 2013-06-19: qty 2

## 2013-06-19 MED ORDER — IBUPROFEN 600 MG PO TABS: 600.0000 mg | ORAL_TABLET | Freq: Four times a day (QID) | ORAL | Status: DC | PRN

## 2013-06-19 MED ORDER — BUTORPHANOL TARTRATE 1 MG/ML IJ SOLN
1.0000 mg | INTRAMUSCULAR | Status: DC | PRN
  Administered 2013-06-19: 1 mg via INTRAVENOUS
  Filled 2013-06-19: qty 1

## 2013-06-19 MED ORDER — DIPHENHYDRAMINE HCL 50 MG/ML IJ SOLN: 12.5000 mg | INTRAMUSCULAR | Status: DC | PRN

## 2013-06-19 MED ORDER — PHENYLEPHRINE 40 MCG/ML (10ML) SYRINGE FOR IV PUSH (FOR BLOOD PRESSURE SUPPORT)
80.0000 ug | PREFILLED_SYRINGE | INTRAVENOUS | Status: DC | PRN
  Filled 2013-06-19: qty 2

## 2013-06-19 MED ORDER — BUTORPHANOL TARTRATE 2 MG/ML IJ SOLN: 14.0000 mg | Freq: Once | INTRAMUSCULAR | Status: DC

## 2013-06-19 MED ORDER — FENTANYL CITRATE 0.05 MG/ML IJ SOLN: 100.0000 ug | Freq: Once | INTRAMUSCULAR | Status: DC

## 2013-06-19 MED ORDER — LIDOCAINE HCL (PF) 1 % IJ SOLN
30.0000 mL | INTRAMUSCULAR | Status: DC | PRN
  Filled 2013-06-19 (×2): qty 30

## 2013-06-19 MED ORDER — LACTATED RINGERS IV SOLN
500.0000 mL | Freq: Once | INTRAVENOUS | Status: AC
  Administered 2013-06-19: 500 mL via INTRAVENOUS

## 2013-06-19 MED ORDER — PHENYLEPHRINE 40 MCG/ML (10ML) SYRINGE FOR IV PUSH (FOR BLOOD PRESSURE SUPPORT)
80.0000 ug | PREFILLED_SYRINGE | INTRAVENOUS | Status: DC | PRN
  Filled 2013-06-19: qty 10
  Filled 2013-06-19: qty 2

## 2013-06-19 MED ORDER — LIDOCAINE HCL (PF) 1 % IJ SOLN
INTRAMUSCULAR | Status: DC | PRN
  Administered 2013-06-19 (×4): 4 mL

## 2013-06-19 MED ORDER — ACETAMINOPHEN 325 MG PO TABS: 650.0000 mg | ORAL_TABLET | ORAL | Status: DC | PRN

## 2013-06-19 MED ORDER — ONDANSETRON HCL 4 MG/2ML IJ SOLN: 4.0000 mg | Freq: Four times a day (QID) | INTRAMUSCULAR | Status: DC | PRN

## 2013-06-19 MED ORDER — BUTORPHANOL TARTRATE 1 MG/ML IJ SOLN
1.0000 mg | Freq: Once | INTRAMUSCULAR | Status: AC
  Administered 2013-06-19: 1 mg via INTRAVENOUS

## 2013-06-19 MED ORDER — BUTORPHANOL TARTRATE 1 MG/ML IJ SOLN
INTRAMUSCULAR | Status: AC
  Filled 2013-06-19: qty 1

## 2013-06-19 MED ORDER — OXYCODONE-ACETAMINOPHEN 5-325 MG PO TABS: 1.0000 | ORAL_TABLET | ORAL | Status: DC | PRN

## 2013-06-19 MED ORDER — LACTATED RINGERS IV SOLN
INTRAVENOUS | Status: DC
  Administered 2013-06-19 (×2): via INTRAVENOUS

## 2013-06-19 NOTE — Anesthesia Preprocedure Evaluation (Signed)
Anesthesia Evaluation  Patient identified by MRN, date of birth, ID band Patient awake    Reviewed: Allergy & Precautions, H&P , NPO status , Patient's Chart, lab work & pertinent test results, reviewed documented beta blocker date and time   History of Anesthesia Complications Negative for: history of anesthetic complications  Airway Mallampati: II TM Distance: >3 FB Neck ROM: full    Dental  (+) Teeth Intact   Pulmonary neg pulmonary ROS,  breath sounds clear to auscultation        Cardiovascular negative cardio ROS  Rhythm:regular Rate:Normal     Neuro/Psych Hypersomnia/narcolepsy negative psych ROS   GI/Hepatic negative GI ROS, Neg liver ROS,   Endo/Other  negative endocrine ROS  Renal/GU negative Renal ROS     Musculoskeletal   Abdominal   Peds  Hematology negative hematology ROS (+)   Anesthesia Other Findings   Reproductive/Obstetrics (+) Pregnancy                           Anesthesia Physical Anesthesia Plan  ASA: II  Anesthesia Plan: Epidural   Post-op Pain Management:    Induction:   Airway Management Planned:   Additional Equipment:   Intra-op Plan:   Post-operative Plan:   Informed Consent: I have reviewed the patients History and Physical, chart, labs and discussed the procedure including the risks, benefits and alternatives for the proposed anesthesia with the patient or authorized representative who has indicated his/her understanding and acceptance.     Plan Discussed with:   Anesthesia Plan Comments:         Anesthesia Quick Evaluation

## 2013-06-19 NOTE — MAU Note (Signed)
Patient states she had leaking of clear fluid at 0130. States she is having irregular contractions. No active leaking at this time. Reports good fetal movement.

## 2013-06-19 NOTE — H&P (Signed)
Rhonda Bird is a 28 y.o. female presenting for PROM at 0130 this AM.   She reports rare, mild CTX since that time.  No VB.  +FM.  Antepartum course complicated by narcolepsy on no meds.  GBS negative.  Maternal Medical History:  Reason for admission: Rupture of membranes.   Fetal activity: Perceived fetal activity is normal.   Last perceived fetal movement was within the past hour.    Prenatal complications: no prenatal complications Prenatal Complications - Diabetes: none.    OB History   Grav Para Term Preterm Abortions TAB SAB Ect Mult Living   5 1 1  0 3 3 0 0 0 1     Past Medical History  Diagnosis Date  . Pregnancy induced hypertension     first preg  . Vaginal Pap smear, abnormal     cryo, ok since  . Narcolepsy     per prenatal   Past Surgical History  Procedure Laterality Date  . Tonsillectomy    . Dilation and curettage of uterus     Family History: family history is negative for Other. She was adopted. Social History:  reports that she has never smoked. She has never used smokeless tobacco. She reports that she does not drink alcohol or use illicit drugs.   Prenatal Transfer Tool  Maternal Diabetes: No Genetic Screening: Normal Maternal Ultrasounds/Referrals: Normal Fetal Ultrasounds or other Referrals:  None Maternal Substance Abuse:  No Significant Maternal Medications:  None Significant Maternal Lab Results:  Lab values include: Group B Strep negative Other Comments:  None  ROS  Dilation: 3 Effacement (%): 70 Station: -3 Exam by:: Lytle [place] RNC Blood pressure 130/82, pulse 76, temperature 98.2 F (36.8 C), temperature source Oral, resp. rate 18, height 5\' 7"  (1.702 m), weight 176 lb 6.4 oz (80.015 kg), last menstrual period 04/02/2012. Maternal Exam:  Uterine Assessment: Contraction strength is mild.  Contraction frequency is rare.   Abdomen: Fundal height is c/w dates.   Estimated fetal weight is 7#8.       Physical Exam   Constitutional: She is oriented to person, place, and time. She appears well-developed and well-nourished.  GI: Soft. There is no tenderness. There is no rebound and no guarding.  Neurological: She is alert and oriented to person, place, and time.  Skin: Skin is warm and dry.  Psychiatric: She has a normal mood and affect. Her behavior is normal.    Prenatal labs: ABO, Rh:   Antibody: Negative (06/24 0000) Rubella: Immune (06/24 0000) RPR: Nonreactive (06/24 0000)  HBsAg: Negative (06/24 0000)  HIV: Non-reactive (06/24 0000)  GBS: Negative (01/19 0000)   Assessment/Plan: 28yo [id number] at [redacted]w[redacted]d with PROM -Will start pitocin; patient counseled -Epidural when desired -GBS negative -Anticipate NSVD   Rhonda Bird 06/19/2013, 1:18 PM

## 2013-06-19 NOTE — Anesthesia Procedure Notes (Signed)
Epidural Patient location during procedure: OB Start time: 06/19/2013 8:28 PM  Staffing Performed by: anesthesiologist   Preanesthetic Checklist Completed: patient identified, site marked, surgical consent, pre-op evaluation, timeout performed, IV checked, risks and benefits discussed and monitors and equipment checked  Epidural Patient position: sitting Prep: site prepped and draped and DuraPrep Patient monitoring: continuous pulse ox and blood pressure Approach: midline Injection technique: LOR air  Needle:  Needle type: Tuohy  Needle gauge: 17 G Needle length: 9 cm and 9 Needle insertion depth: 4.5 cm Catheter type: closed end flexible Catheter size: 19 Gauge Catheter at skin depth: 9.5 cm Test dose: negative  Assessment Events: blood not aspirated, injection not painful, no injection resistance, negative IV test and no paresthesia  Additional Notes Discussed risk of headache, infection, bleeding, nerve injury and failed or incomplete block.  Patient voices understanding and wishes to proceed.  Epidural placed easily on first pass.  No paresthesia.  Patient tolerated procedure well with no apparent complications.  Charlton Haws, MDReason for block:procedure for pain

## 2013-06-19 NOTE — MAU Note (Signed)
discrepancy noted pt hx and prenatal (G's/P's)  Pt confirmed this is her 5th preg, 3 TAB

## 2013-06-19 NOTE — MAU Note (Signed)
Woke up at 0130, panty liner was soaked, more again when got up. No bleeding, some irreg ctx's.

## 2013-06-20 DIAGNOSIS — Z3490 Encounter for supervision of normal pregnancy, unspecified, unspecified trimester: Secondary | ICD-10-CM

## 2013-06-20 LAB — CBC
HEMATOCRIT: 29.7 % — AB (ref 36.0–46.0)
HEMOGLOBIN: 10.3 g/dL — AB (ref 12.0–15.0)
MCH: 30.5 pg (ref 26.0–34.0)
MCHC: 34.7 g/dL (ref 30.0–36.0)
MCV: 87.9 fL (ref 78.0–100.0)
Platelets: 178 10*3/uL (ref 150–400)
RBC: 3.38 MIL/uL — AB (ref 3.87–5.11)
RDW: 13.8 % (ref 11.5–15.5)
WBC: 11.8 10*3/uL — ABNORMAL HIGH (ref 4.0–10.5)

## 2013-06-20 MED ORDER — LANOLIN HYDROUS EX OINT
TOPICAL_OINTMENT | CUTANEOUS | Status: DC | PRN
Start: 2013-06-20 — End: 2013-06-21

## 2013-06-20 MED ORDER — ZOLPIDEM TARTRATE 5 MG PO TABS: 5.0000 mg | ORAL_TABLET | Freq: Every evening | ORAL | Status: DC | PRN

## 2013-06-20 MED ORDER — ONDANSETRON HCL 4 MG PO TABS: 4.0000 mg | ORAL_TABLET | ORAL | Status: DC | PRN

## 2013-06-20 MED ORDER — WITCH HAZEL-GLYCERIN EX PADS: 1.0000 "application " | MEDICATED_PAD | CUTANEOUS | Status: DC | PRN

## 2013-06-20 MED ORDER — BENZOCAINE-MENTHOL 20-0.5 % EX AERO
1.0000 "application " | INHALATION_SPRAY | CUTANEOUS | Status: DC | PRN
  Filled 2013-06-20: qty 56

## 2013-06-20 MED ORDER — PRENATAL MULTIVITAMIN CH
1.0000 | ORAL_TABLET | Freq: Every day | ORAL | Status: DC
  Administered 2013-06-20: 1 via ORAL
  Filled 2013-06-20: qty 1

## 2013-06-20 MED ORDER — TETANUS-DIPHTH-ACELL PERTUSSIS 5-2.5-18.5 LF-MCG/0.5 IM SUSP: 0.5000 mL | Freq: Once | INTRAMUSCULAR | Status: DC

## 2013-06-20 MED ORDER — ONDANSETRON HCL 4 MG/2ML IJ SOLN: 4.0000 mg | INTRAMUSCULAR | Status: DC | PRN

## 2013-06-20 MED ORDER — DIBUCAINE 1 % RE OINT: 1.0000 "application " | TOPICAL_OINTMENT | RECTAL | Status: DC | PRN

## 2013-06-20 MED ORDER — SIMETHICONE 80 MG PO CHEW: 80.0000 mg | CHEWABLE_TABLET | ORAL | Status: DC | PRN

## 2013-06-20 MED ORDER — DIPHENHYDRAMINE HCL 25 MG PO CAPS: 25.0000 mg | ORAL_CAPSULE | Freq: Four times a day (QID) | ORAL | Status: DC | PRN

## 2013-06-20 MED ORDER — SENNOSIDES-DOCUSATE SODIUM 8.6-50 MG PO TABS
2.0000 | ORAL_TABLET | ORAL | Status: DC
  Administered 2013-06-20 – 2013-06-21 (×2): 2 via ORAL
  Filled 2013-06-20: qty 2

## 2013-06-20 MED ORDER — OXYCODONE-ACETAMINOPHEN 5-325 MG PO TABS: 1.0000 | ORAL_TABLET | ORAL | Status: DC | PRN

## 2013-06-20 MED ORDER — IBUPROFEN 600 MG PO TABS
600.0000 mg | ORAL_TABLET | Freq: Four times a day (QID) | ORAL | Status: DC
  Administered 2013-06-20 – 2013-06-21 (×6): 600 mg via ORAL
  Filled 2013-06-20 (×5): qty 1

## 2013-06-20 NOTE — Progress Notes (Signed)
Post Partum Day 1 Subjective: no complaints, up ad lib, voiding, tolerating PO and + flatus  Objective: Blood pressure 115/69, pulse 65, temperature 98.6 F (37 C), temperature source Oral, resp. rate 16, height 5\' 7"  (1.702 m), weight 176 lb 6.4 oz (80.015 kg), last menstrual period 04/02/2012, SpO2 100.00%, unknown if currently breastfeeding.  Physical Exam:  General: alert and cooperative Lochia: appropriate Uterine Fundus: firm Incision: perineum intact DVT Evaluation: No evidence of DVT seen on physical exam. Negative Homan's sign. No cords or calf tenderness. No significant calf/ankle edema.   Recent Labs  06/19/13 1300 06/20/13 0625  HGB 11.5* 10.3*  HCT 32.7* 29.7*    Assessment/Plan: Plan for discharge tomorrow Plans to have circ with pediatrician ([facility])    LOS: 1 day   Rhonda Bird G 06/20/2013, 8:04 AM

## 2013-06-20 NOTE — Lactation Note (Signed)
This note was copied from the chart of [place]. Lactation Consultation Note  Patient Name: Rhonda Bird [id number] Date: 06/20/2013 Reason for consult: Follow-up assessment;Difficult latch, per mom, similar to her experience with her first child 8 years ago.  Mom states when latching with other child was difficult, she started pumping but soon got tired of pumping and switched to formula/bottle feeding.  LC arrived to observe baby well-latched to mom's (R) breast in football position and sucking with strong bursts and intermittent swallows.  Lips widely flanged and mom denies nipple pain but states it took over 30 minutes to latch baby and that her (L) nipple makes latch more difficult.  LC discussed plan with mom and her nurse, RN, Asencion Partridge, including cue feedings and trying a NS on (L) after baby completes feeding on (R).  Momto call RN or LC for assistance.   Maternal Data    Feeding Feeding Type: Breast Fed Length of feed:  (difficult latch but LC observed at [place] with strong sucking )  LATCH Score/Interventions Latch: Repeated attempts needed to sustain latch, nipple held in mouth throughout feeding, stimulation needed to elicit sucking reflex. Intervention(s):  (mom states it took a while to finally latch baby)  Audible Swallowing: Spontaneous and intermittent (LC noted swallows at intervals)  Type of Nipple: Everted at rest and after stimulation (per mom but (L) flat)  Comfort (Breast/Nipple): Soft / non-tender     Hold (Positioning): No assistance needed to correctly position infant at breast.  LATCH Score: 9  (based on observation and mom's report to [facility])  Lactation Tools Discussed/Used   Cue feedings, possible need for NS on (L) breast Signs of proper latch and milk transfer  Consult Status Consult Status: Follow-up Date: 06/21/13 Follow-up type: In-patient    Junious Dresser [facility] 06/20/2013, 8:10 PM

## 2013-06-20 NOTE — Anesthesia Postprocedure Evaluation (Signed)
  Anesthesia Post-op Note  Anesthesia Post Note  Patient: Rhonda Bird  Procedure(s) Performed: * No procedures listed *  Anesthesia type: Epidural  Patient location: Mother/Baby  Post pain: Pain level controlled  Post assessment: Post-op Vital signs reviewed  Last Vitals:  Filed Vitals:   06/20/13 0528  BP: 115/69  Pulse: 65  Temp: 37 C  Resp: 16    Post vital signs: Reviewed  Level of consciousness:alert  Complications: No apparent anesthesia complications

## 2013-06-21 MED ORDER — IBUPROFEN 600 MG PO TABS: 600.0000 mg | ORAL_TABLET | Freq: Four times a day (QID) | ORAL | Status: DC

## 2013-06-21 NOTE — Lactation Note (Signed)
This note was copied from the chart of [place]. Lactation Consultation Note  Patient Name: Rhonda Bird XIPJA'S Date: 06/21/2013 Reason for consult: Follow-up assessment;Breast/nipple pain  Visited with Mom on day of discharge, baby at 44 hrs old.  Mom very anxious about feedings, and latching baby to the breast as she is very sore.  Assisted with feeding this am, and guided Mom in using good breast support and helping baby latch deeply onto breast.  Baby latched well, and had Mom latch baby herself.  Mom denied any discomfort during feeding.  Both nipples skin intact, Comfort Gels given with instructions on use.  Instructed EBM to nipples post feedings.  Encouraged skin to skin and frequent feedings on cue.  Basics reviewed.  Engorgement prevention and treatment discussed.  OP appointment given 06/27/13 @ 4pm.  To call prn.  Consult Status Consult Status: Follow-up Date: 06/27/13 Follow-up type: [place], Cullen Vanallen E 06/21/2013, 10:05 AM

## 2013-06-21 NOTE — Discharge Summary (Signed)
Obstetric Discharge Summary Reason for Admission: rupture of membranes Prenatal Procedures: ultrasound Intrapartum Procedures: spontaneous vaginal delivery Postpartum Procedures: none Complications-Operative and Postpartum: none Hemoglobin  Date Value Range Status  06/20/2013 10.3* 12.0 - 15.0 g/dL Final     HCT  Date Value Range Status  06/20/2013 29.7* 36.0 - 46.0 % Final    Physical Exam:  General: alert, cooperative and appears stated age 28: appropriate Uterine Fundus: firm Incision: perineum intact DVT Evaluation: No evidence of DVT seen on physical exam. Negative Homan's sign. No cords or calf tenderness. No significant calf/ankle edema.  Discharge Diagnoses: Term Pregnancy-delivered  Discharge Information: Date: 06/21/2013 Activity: pelvic rest Diet: routine Medications: PNV and Ibuprofen Condition: stable Instructions: refer to practice specific booklet Discharge to: home   Newborn Data: Live born female  Birth Weight: 7 lb 3.9 oz (3285 g) APGAR: 9, 9  Home with mother.  Rhonda Bird G 06/21/2013, 8:03 AM

## 2013-06-22 ENCOUNTER — Encounter (HOSPITAL_COMMUNITY): Payer: Self-pay | Admitting: General Practice

## 2013-06-22 ENCOUNTER — Inpatient Hospital Stay (HOSPITAL_COMMUNITY)
Admission: AD | Admit: 2013-06-22 | Discharge: 2013-06-22 | Disposition: A | Discharge status: Home / Self Care | Payer: 59 | Source: Ambulatory Visit | Attending: Obstetrics and Gynecology | Admitting: Obstetrics and Gynecology

## 2013-06-22 DIAGNOSIS — IMO0002 Reserved for concepts with insufficient information to code with codable children: Secondary | ICD-10-CM | POA: Insufficient documentation

## 2013-06-22 DIAGNOSIS — O135 Gestational [pregnancy-induced] hypertension without significant proteinuria, complicating the puerperium: Secondary | ICD-10-CM | POA: Diagnosis not present

## 2013-06-22 DIAGNOSIS — O165 Unspecified maternal hypertension, complicating the puerperium: Secondary | ICD-10-CM

## 2013-06-22 DIAGNOSIS — O1205 Gestational edema, complicating the puerperium: Secondary | ICD-10-CM

## 2013-06-22 LAB — URINALYSIS, ROUTINE W REFLEX MICROSCOPIC
Bilirubin Urine: NEGATIVE
GLUCOSE, UA: NEGATIVE mg/dL
Ketones, ur: NEGATIVE mg/dL
LEUKOCYTES UA: NEGATIVE
Nitrite: NEGATIVE
Protein, ur: NEGATIVE mg/dL
SPECIFIC GRAVITY, URINE: 1.02 (ref 1.005–1.030)
Urobilinogen, UA: 0.2 mg/dL (ref 0.0–1.0)
pH: 7.5 (ref 5.0–8.0)

## 2013-06-22 LAB — COMPREHENSIVE METABOLIC PANEL
ALBUMIN: 3 g/dL — AB (ref 3.5–5.2)
ALK PHOS: 107 U/L (ref 39–117)
ALT: 13 U/L (ref 0–35)
AST: 23 U/L (ref 0–37)
BILIRUBIN TOTAL: 0.2 mg/dL — AB (ref 0.3–1.2)
BUN: 12 mg/dL (ref 6–23)
CHLORIDE: 98 meq/L (ref 96–112)
CO2: 25 meq/L (ref 19–32)
CREATININE: 0.73 mg/dL (ref 0.50–1.10)
Calcium: 9.3 mg/dL (ref 8.4–10.5)
GFR calc Af Amer: >90 mL/min (ref >90)
Glucose, Bld: 82 mg/dL (ref 70–99)
POTASSIUM: 3.7 meq/L (ref 3.7–5.3)
Sodium: 136 mEq/L — ABNORMAL LOW (ref 137–147)
Total Protein: 6.7 g/dL (ref 6.0–8.3)

## 2013-06-22 LAB — CBC
HCT: 33 % — ABNORMAL LOW (ref 36.0–46.0)
Hemoglobin: 11.2 g/dL — ABNORMAL LOW (ref 12.0–15.0)
MCH: 30.6 pg (ref 26.0–34.0)
MCHC: 33.9 g/dL (ref 30.0–36.0)
MCV: 90.2 fL (ref 78.0–100.0)
PLATELETS: 230 10*3/uL (ref 150–400)
RBC: 3.66 MIL/uL — AB (ref 3.87–5.11)
RDW: 14 % (ref 11.5–15.5)
WBC: 10.6 10*3/uL — AB (ref 4.0–10.5)

## 2013-06-22 LAB — URINE MICROSCOPIC-ADD ON

## 2013-06-22 LAB — PROTEIN / CREATININE RATIO, URINE
Creatinine, Urine: 105.65 mg/dL
Protein Creatinine Ratio: 0.05 (ref 0.00–0.15)
TOTAL PROTEIN, URINE: 5.1 mg/dL

## 2013-06-22 MED ORDER — HYDROCHLOROTHIAZIDE 25 MG PO TABS: 25.0000 mg | ORAL_TABLET | Freq: Every day | ORAL | Status: DC

## 2013-06-22 NOTE — Discharge Instructions (Signed)
Hypertension During Pregnancy Hypertension is also called high blood pressure. It can occur at any time in life and during pregnancy. When you have hypertension, there is extra pressure inside your blood vessels that carry blood from the heart to the rest of your body (arteries). Hypertension during pregnancy can cause problems for you and your baby. Your baby might not weigh as much as it should at birth or might be born early (premature). Very bad cases of hypertension during pregnancy can be life threatening.  Different types of hypertension can occur during pregnancy.   Chronic hypertension. This happens when a woman has hypertension before pregnancy and it continues during pregnancy.  Gestational hypertension. This is when hypertension develops during pregnancy.  Preeclampsia or toxemia of pregnancy. This is a very serious type of hypertension that develops only during pregnancy. It is a disease that affects the whole body (systemic) and can be very dangerous for both mother and baby.  Gestational hypertension and preeclampsia usually go away after your baby is born. Blood pressure generally stabilizes within 6 weeks. Women who have hypertension during pregnancy have a greater chance of developing hypertension later in life or with future pregnancies. RISK FACTORS Some factors make you more likely to develop hypertension during pregnancy. Risk factors include:  Having hypertension before pregnancy.  Having hypertension during a previous pregnancy.  Being overweight.  Being older than 40.  Being pregnant with more than one baby (multiples).  Having diabetes or kidney problems. SIGNS AND SYMPTOMS Chronic and gestational hypertension rarely cause symptoms. Preeclampsia has symptoms, which may include:  Increased protein in your urine. Your health care provider will check for this at every prenatal visit.  Swelling of your hands and face.  Rapid weight gain.  Headaches.  Visual  changes.  Being bothered by light.  Abdominal pain, especially in the right upper area.  Chest pain.  Shortness of breath.  Increased reflexes.  Seizures. Seizures occur with a more severe form of preeclampsia, called eclampsia. DIAGNOSIS   You may be diagnosed with hypertension during a regular prenatal exam. At each visit, tests may include:  Blood pressure checks.  A urine test to check for protein in your urine.  The type of hypertension you are diagnosed with depends on when you developed it. It also depends on your specific blood pressure reading.  Developing hypertension before 20 weeks of pregnancy is consistent with chronic hypertension.  Developing hypertension after 20 weeks of pregnancy is consistent with gestational hypertension.  Hypertension with increased urinary protein is diagnosed as preeclampsia.  Blood pressure measurements that stay above 160 systolic or 110 diastolic are a sign of severe preeclampsia. TREATMENT Treatment for hypertension during pregnancy varies. Treatment depends on the type of hypertension and how serious it is.  If you take medicine for chronic hypertension, you may need to switch medicines.  Drugs called ACE inhibitors should not be taken during pregnancy.  Low-dose aspirin may be suggested for women who have risk factors for preeclampsia.  If you have gestational hypertension, you may need to take a blood pressure medicine that is safe during pregnancy. Your health care provider will recommend the appropriate medicine.  If you have severe preeclampsia, you may need to be in the hospital. Health care providers will watch you and the baby very closely. You also may need to take medicine (magnesium sulfate) to prevent seizures and lower blood pressure.  Sometimes an early delivery is needed. This may be the case if the condition worsens. It would   be done to protect you and the baby. The only cure for preeclampsia is delivery. HOME  CARE INSTRUCTIONS  Schedule and keep all of your regular appointments for prenatal care.  Only take over-the-counter or prescription medicines as directed by your health care provider. Tell your health care provider about all medicines you take.  Eat as little salt as possible.  Get regular exercise.  Do not drink alcohol.  Do not use tobacco products.  Do not drink products with caffeine.  Lie on your left side when resting. SEEK IMMEDIATE MEDICAL CARE IF:  You have severe abdominal pain.  You have sudden swelling in the hands, ankles, or face.  You gain 4 pounds (1.8 kg) or more in 1 week.  You vomit repeatedly.  You have vaginal bleeding.  You do not feel the baby moving as much.  You have a headache.  You have blurred or double vision.  You have muscle twitching or spasms.  You have shortness of breath.  You have blue fingernails and lips.  You have blood in your urine. MAKE SURE YOU:  Understand these instructions.  Will watch your condition.  Will get help right away if you are not doing well or get worse. Document Released: 02/03/2011 Document Revised: 03/08/2013 Document Reviewed: 12/15/2012 [employer] Patient Information 2014 [employer].  

## 2013-06-22 NOTE — MAU Note (Signed)
Pt post vag delivery 06/19/2013, today having swelling in ankles and feet, right side greater than left.

## 2013-06-22 NOTE — MAU Provider Note (Signed)
Chief Complaint: Leg Swelling   First Provider Initiated Contact with Patient 06/22/13 2051     SUBJECTIVE HPI: Rhonda Bird is a 28 y.o. [id number] at 3 days S/P SVD who presents with bilat pedal edema, right more than left today. No Hx HTN this pregnancy, but did have Gest HTN w/ previous pregnancy. Denies fever, chills, vision changes, HA, epigastric pain.   Past Medical History  Diagnosis Date  . Pregnancy induced hypertension     first preg  . Vaginal Pap smear, abnormal     cryo, ok since  . Narcolepsy     per prenatal   OB History  Gravida Para Term Preterm AB SAB TAB Ectopic Multiple Living  5 2 2  0 3 0 3 0 0 2    # Outcome Date GA Lbr Len/2nd Weight Sex Delivery Anes PTL Lv  5 TRM 06/19/13 [redacted]w[redacted]d 22:40 / 00:18 3.285 kg (7 lb 3.9 oz) M SVD EPI  Y  4 TRM 11/06/04    M SVD  N      Comments: induced PIH  3 TAB           2 TAB           1 TAB              Past Surgical History  Procedure Laterality Date  . Tonsillectomy    . Dilation and curettage of uterus     History   Social History  . Marital Status: Single    Spouse Name: N/A    Number of Children: N/A  . Years of Education: N/A   Occupational History  . Not on file.   Social History Main Topics  . Smoking status: Never Smoker   . Smokeless tobacco: Never Used  . Alcohol Use: No     Comment: socially  . Drug Use: No  . Sexual Activity: Yes    Birth Control/ Protection: None   Other Topics Concern  . Not on file   Social History Narrative  . No narrative on file   No current facility-administered medications on file prior to encounter.   Current Outpatient Prescriptions on File Prior to Encounter  Medication Sig Dispense Refill  . acetaminophen (TYLENOL) 500 MG tablet Take 500 mg by mouth every 6 (six) hours as needed for headache.      . ibuprofen (ADVIL,MOTRIN) 600 MG tablet Take 1 tablet (600 mg total) by mouth every 6 (six) hours.  30 tablet  1   Allergies  Allergen Reactions  .  Penicillins Hives    ROS: Pertinent items in HPI.   OBJECTIVE Blood pressure 143/97, pulse 80, temperature 98.6 F (37 C), temperature source Oral, resp. rate 18, height 5\' 6"  (1.676 m), weight 75.841 kg (167 lb 3.2 oz), currently breastfeeding. GENERAL: Well-developed, well-nourished female in no acute distress.  HEENT: Normocephalic HEART: normal rate RESP: normal effort ABDOMEN: Soft, non-tender EXTREMITIES: Nontender, 3+ pedal edema on right, 2+ on left. Calves soft, NT, Neg Homan's, symmetric.  NEURO: Alert and oriented. DTRs 1+, no clonus. SPECULUM EXAM: Deferred.  LAB RESULTS Results for orders placed during the hospital encounter of 06/22/13 (from the past 24 hour(s))  URINALYSIS, ROUTINE W REFLEX MICROSCOPIC     Status: Abnormal   Collection Time    06/22/13  8:00 PM      Result Value Range   Color, Urine YELLOW  YELLOW   APPearance CLEAR  CLEAR   Specific Gravity, Urine 1.020  1.005 - 1.030  pH 7.5  5.0 - 8.0   Glucose, UA NEGATIVE  NEGATIVE mg/dL   Hgb urine dipstick MODERATE (*) NEGATIVE   Bilirubin Urine NEGATIVE  NEGATIVE   Ketones, ur NEGATIVE  NEGATIVE mg/dL   Protein, ur NEGATIVE  NEGATIVE mg/dL   Urobilinogen, UA 0.2  0.0 - 1.0 mg/dL   Nitrite NEGATIVE  NEGATIVE   Leukocytes, UA NEGATIVE  NEGATIVE  PROTEIN / CREATININE RATIO, URINE     Status: None   Collection Time    06/22/13  8:00 PM      Result Value Range   Creatinine, Urine 105.65     Total Protein, Urine 5.1     PROTEIN CREATININE RATIO 0.05  0.00 - 0.15  URINE MICROSCOPIC-ADD ON     Status: None   Collection Time    06/22/13  8:00 PM      Result Value Range   Squamous Epithelial / LPF RARE  RARE   WBC, UA 0-2  <3 WBC/hpf   RBC / HPF 3-6  <3 RBC/hpf   Bacteria, UA RARE  RARE  CBC     Status: Abnormal   Collection Time    06/22/13  8:03 PM      Result Value Range   WBC 10.6 (*) 4.0 - 10.5 K/uL   RBC 3.66 (*) 3.87 - 5.11 MIL/uL   Hemoglobin 11.2 (*) 12.0 - 15.0 g/dL   HCT 33.0 (*) 36.0  - 46.0 %   MCV 90.2  78.0 - 100.0 fL   MCH 30.6  26.0 - 34.0 pg   MCHC 33.9  30.0 - 36.0 g/dL   RDW 14.0  11.5 - 15.5 %   Platelets 230  150 - 400 K/uL  COMPREHENSIVE METABOLIC PANEL     Status: Abnormal   Collection Time    06/22/13  8:03 PM      Result Value Range   Sodium 136 (*) 137 - 147 mEq/L   Potassium 3.7  3.7 - 5.3 mEq/L   Chloride 98  96 - 112 mEq/L   CO2 25  19 - 32 mEq/L   Glucose, Bld 82  70 - 99 mg/dL   BUN 12  6 - 23 mg/dL   Creatinine, Ser 0.73  0.50 - 1.10 mg/dL   Calcium 9.3  8.4 - 10.5 mg/dL   Total Protein 6.7  6.0 - 8.3 g/dL   Albumin 3.0 (*) 3.5 - 5.2 g/dL   AST 23  0 - 37 U/L   ALT 13  0 - 35 U/L   Alkaline Phosphatase 107  39 - 117 U/L   Total Bilirubin 0.2 (*) 0.3 - 1.2 mg/dL   GFR calc non Af Amer >90  >90 mL/min   GFR calc Af Amer >90  >90 mL/min    IMAGING No results found.  MAU COURSE  ASSESSMENT 1. Postpartum hypertension   2. Postpartum edema    PLAN Discharge home in stable condition per consult w/ Dr. Julien Girt.  Pre-E precautions.     Follow-up Information   Follow up with [facility] [place], [place]. On 06/23/2013. (for blood pressure check or as needed if symptoms worsen)    Contact information:   [place] [address] [phone]      Follow up with [place] [facility]. (As needed if symptoms worsen)    Contact information:   [address] [id number] [place] [address] [phone]  Medication List         acetaminophen 500 MG tablet  Commonly known as:  TYLENOL  Take 500 mg by mouth every 6 (six) hours as needed for headache.     hydrochlorothiazide 25 MG tablet  Commonly known as:  HYDRODIURIL  Take 1 tablet (25 mg total) by mouth daily.     ibuprofen 600 MG tablet  Commonly known as:  ADVIL,MOTRIN  Take 1 tablet (600 mg total) by mouth every 6 (six) hours.     prenatal multivitamin Tabs tablet  Take 1 tablet by  mouth daily.       [place], [place] 06/22/2013  9:41 PM

## 2013-06-27 ENCOUNTER — Ambulatory Visit (HOSPITAL_COMMUNITY): Admit: 2013-06-27 | Payer: No Typology Code available for payment source

## 2013-07-04 ENCOUNTER — Ambulatory Visit (HOSPITAL_COMMUNITY): Payer: No Typology Code available for payment source

## 2013-07-10 ENCOUNTER — Ambulatory Visit (HOSPITAL_COMMUNITY)
Admission: RE | Admit: 2013-07-10 | Discharge: 2013-07-10 | Disposition: A | Discharge status: Home / Self Care | Payer: 59 | Source: Ambulatory Visit | Attending: Obstetrics & Gynecology | Admitting: Obstetrics & Gynecology

## 2013-07-10 NOTE — Lactation Note (Signed)
Adult Lactation Consultation Outpatient Visit Note: Mom here today for feeding assessment. Original appointment was made due to sore nipples. Was unable to keep that appointment. Was seen by Irine Seal LC at [facility] a few days after DC. Mom reports that baby is fussy after feedings. Reports that at some feedings he has a hard time latching on, This morning he fed at 12 mn, she woke him at 5 am and he would not latch so she pumped and bottle fed 2 1/2 oz EBM at 8 am. Reports that he usually feds every 3-4 hours. Mom reports that her breasts don't feel very full now. Layton fussy and sucking hard on pacifier when he came for appointment. Mom reports he uses the pacifier a lot because he is so fussy.  Patient Name: Rhonda Bird Date of Birth: 08/30/1985 Gestational Age at Delivery: Unknown Type of Delivery:   Breastfeeding History: Frequency of Breastfeeding: q 3-4 Length of Feeding: 40-60 min Voids: 5-6./day had void while here for appointment Stools: 4-5/day  had large green stool while here  Supplementing / Method: Supplemented this morning because she couldn't get him to latch. Has not pumped or supplemented in over a week. Pumping:  Type of Pump:   Frequency:  Volume:    Comments:    Consultation Evaluation:  Initial Feeding Assessment: Pre-feed Weight: 7- 5.2  3322 g  Mom reports that [place] weighed 6-15- 2 Olando Willems ago at [facility] office Post-feed Weight: 7- 5.9  3342 g Amount Transferred:20 cc's Comments: Lilyan Punt took a a few attempts but then latched on nursed for 10 minutes then sleepy and non nutritive sucking noted. Mom reports that often he is sleepy at the breast after a short time. Her breast feels soft at the beginning of the feeding.   Additional Feeding Assessment: Pre-feed Weight: 7- 5.9  3342g Post-feed Weight: 7- 6.8  3368g Amount Transferred: 26 cc's Comments: Mom's breast feels soft before nursing. Layton latched to the left breast and nursed for 15  minutes then off to sleep. When we weighed him he woke up and was rooting so latched him again. Took a few attempts but he latched and nursed for 5 minutes then off to sleep,   Total Breast milk Transferred this Visit: 32 cc's Total Supplement Given: 0  Additional Interventions: Discussed with mom frequent feedings q 2 hours during the day and q 3 hours at night to promote milk supply. Suggested pumping 3-4 times/day to increase supply. Suggested not using pacifier- if he is fussy and sucking, latch him to the breast. Encouraged watching for feeding cues and feeding whenever she sees them. Discussed undressing baby to keep him awake at feedings. To hand express to let him get a taste before latching. Reviewed we want 15- 20 min of good deep sucks at each breast at each feeding. (Has only been doing one breast at a feeding) Discussed supplementing with SNS but mom does not want to give formula at this time and does not have any EBM with her.   Follow-Up Mom plans to call Ped about green stool and to make appointment with Irine Seal [facility] for follow up weight check and feeding assessment (We have no available appointments for Wed or Thursday here)  To call here for questions/concerns prn   Truddie Crumble 07/10/2013, 11:21 AM

## 2013-12-07 ENCOUNTER — Telehealth: Payer: Self-pay | Admitting: Neurology

## 2013-12-07 NOTE — Telephone Encounter (Signed)
Patient would like to make an appointment to discuss her medication Nuvigil. She has seen Dr. Brett Fairy in the past.  Please call to advise.

## 2013-12-08 NOTE — Telephone Encounter (Signed)
Called patient to schedule appointment with NP MM, no answer left message to return the call to schedule her appointment.

## 2013-12-08 NOTE — Telephone Encounter (Signed)
Patient returned call, wanted to see Dr Dohmeier appointment was scheduled for 12/21/13 at 3:30pm

## 2013-12-08 NOTE — Telephone Encounter (Signed)
Scheduled patient for 12/21/13 3:30 slot but noticed that it was on hold called patient back and changed appointment time to 2:30 pm

## 2013-12-21 ENCOUNTER — Ambulatory Visit: Payer: Self-pay | Admitting: Neurology

## 2014-04-02 ENCOUNTER — Encounter (HOSPITAL_COMMUNITY): Payer: Self-pay | Admitting: General Practice

## 2014-08-15 IMAGING — US US OB COMP LESS 14 WK
1 series · 14 of 19 positions shown · non-contrast
Comparison: None.

CLINICAL DATA: Left lower quadrant pain and spotting.  Estimated
gestational age by LMP is 5 weeks 2 days.  Quantitative beta HCG
27,041

OBSTETRIC <14 WK ULTRASOUND
TECHNIQUE: Transabdominal ultrasound was performed for evaluation
of the gestation as well as the maternal uterus and adnexal
regions.

[Series 1: us ob comp less 14 wks · 14 of 19 slices shown]
[im 1/19]
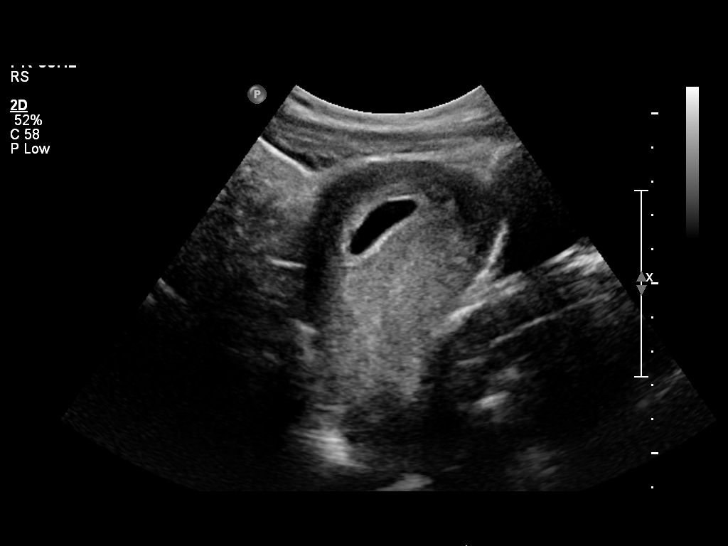
[im 3/19]
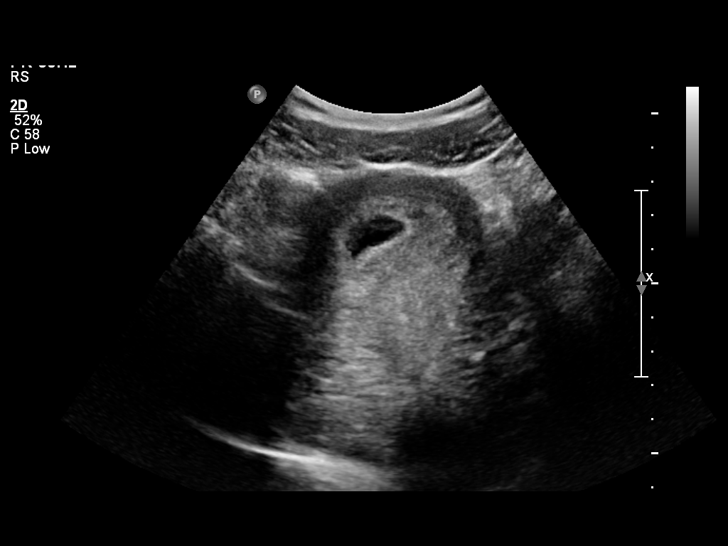
[im 4/19]
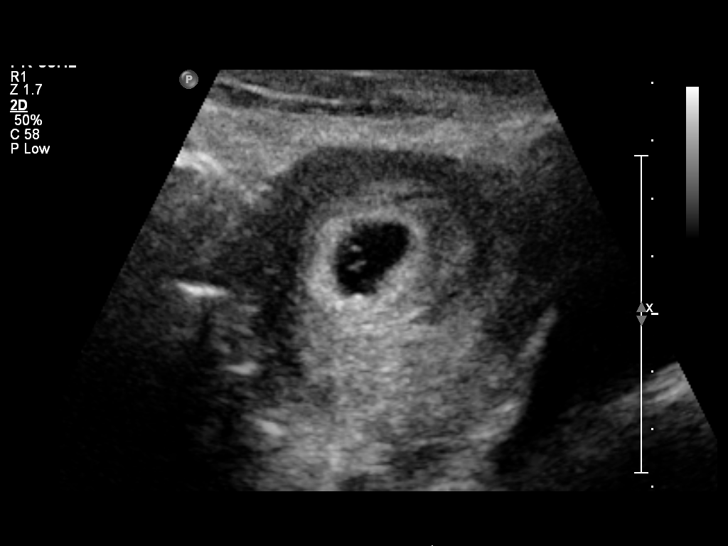
[im 5/19]
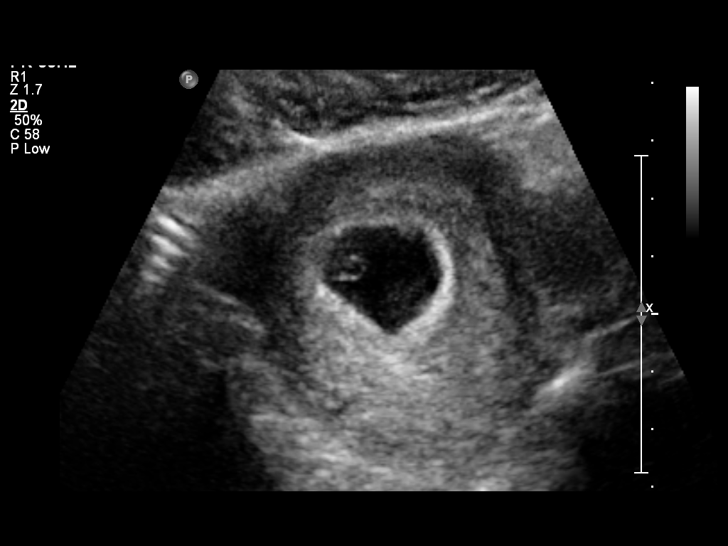
[im 7/19]
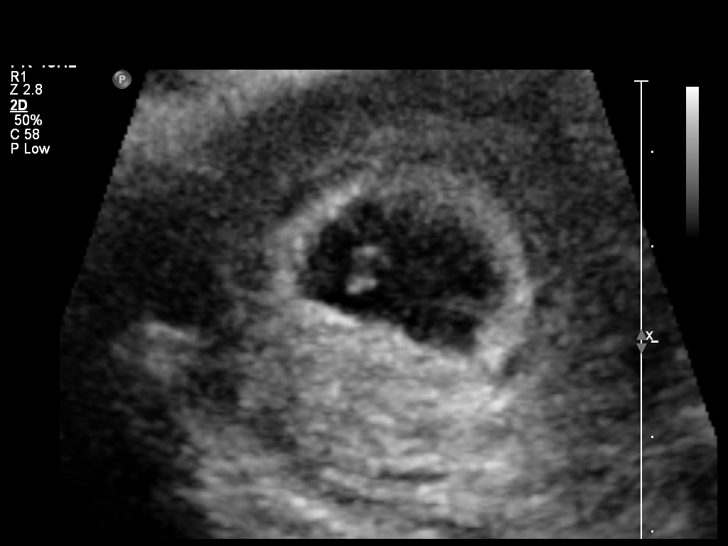
[im 8/19]
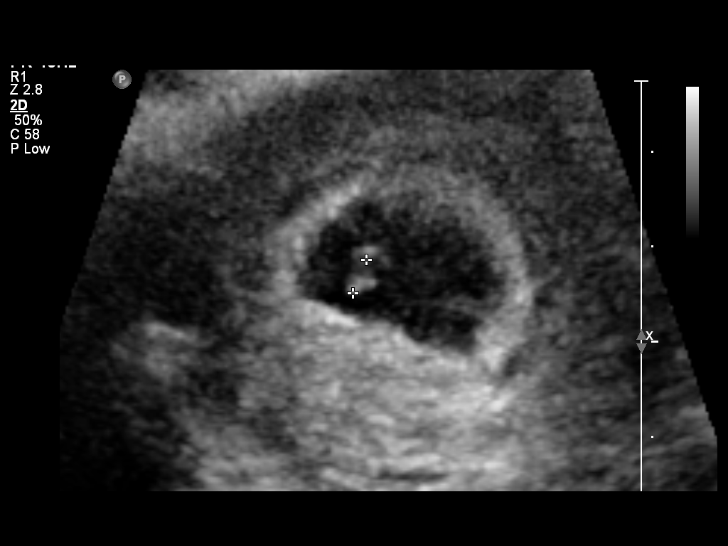
[im 9/19]
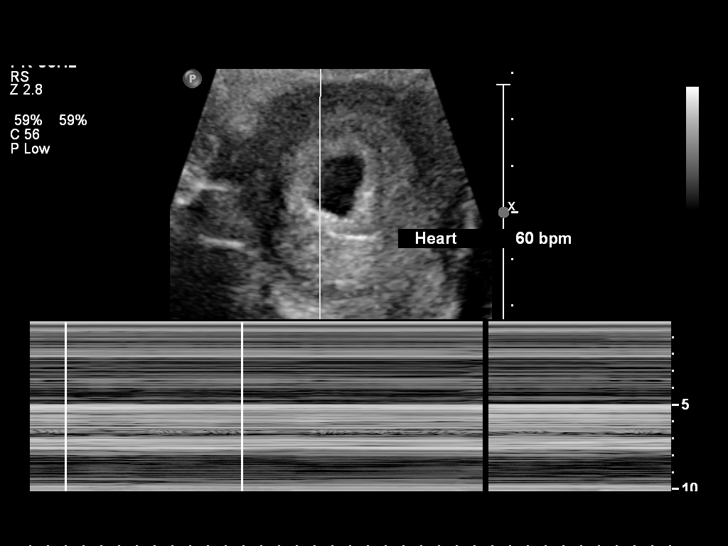
[im 11/19]
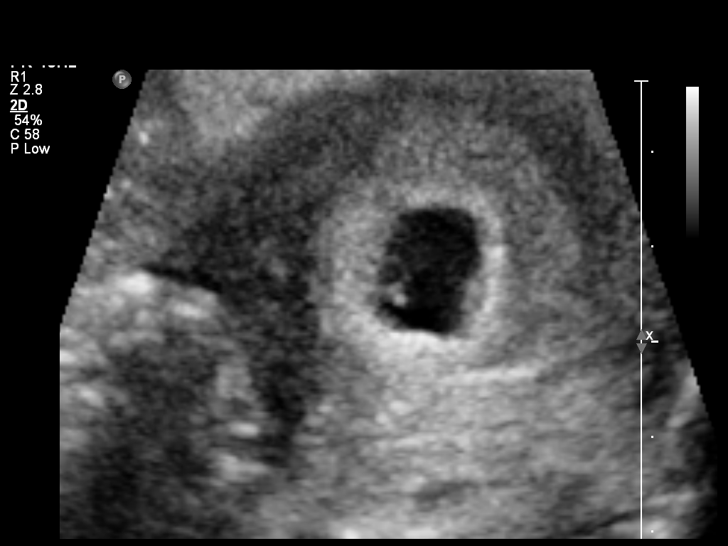
[im 12/19]
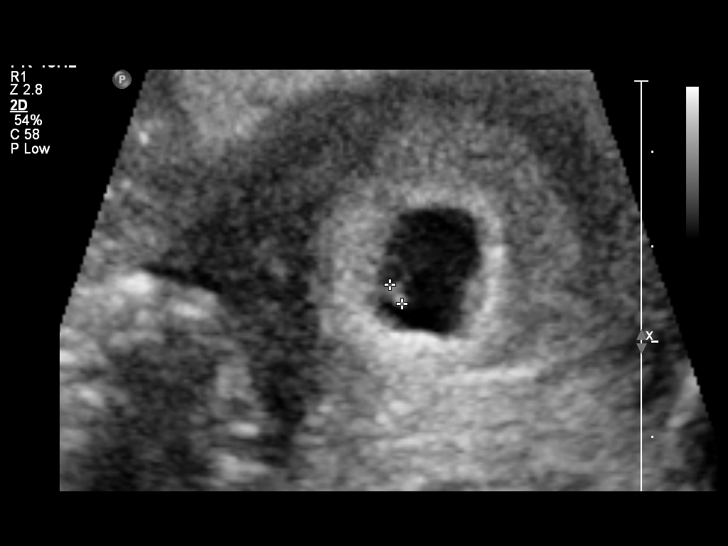
[im 13/19]
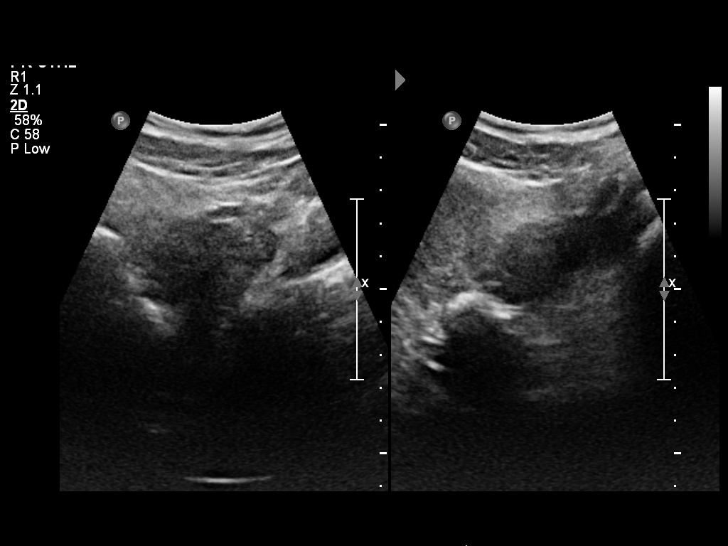
[im 15/19]
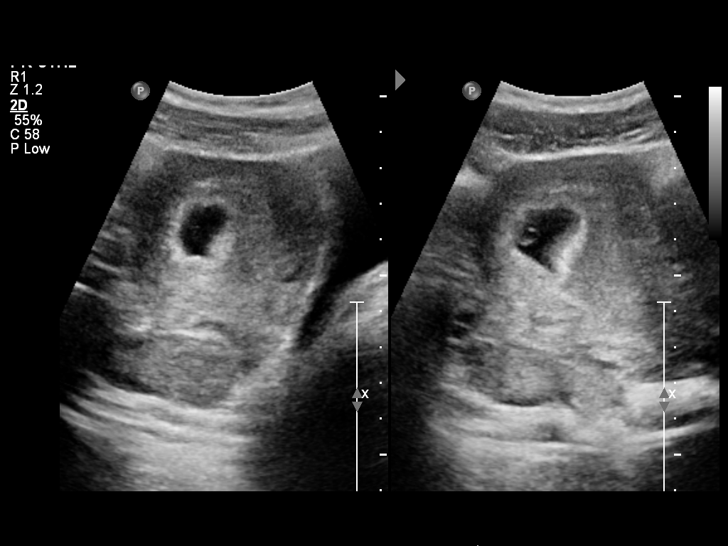
[im 16/19]
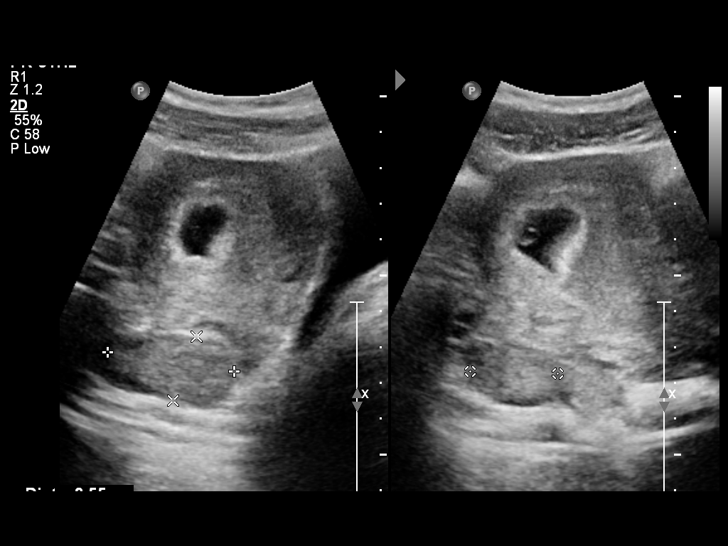
[im 17/19]
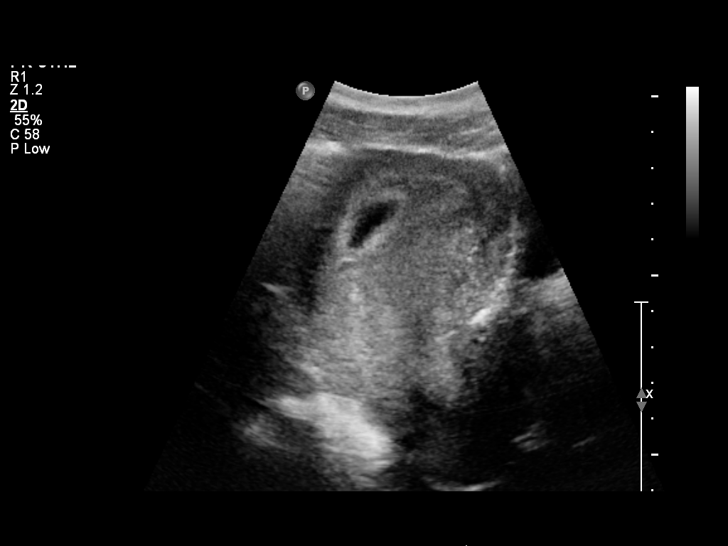
[im 19/19]
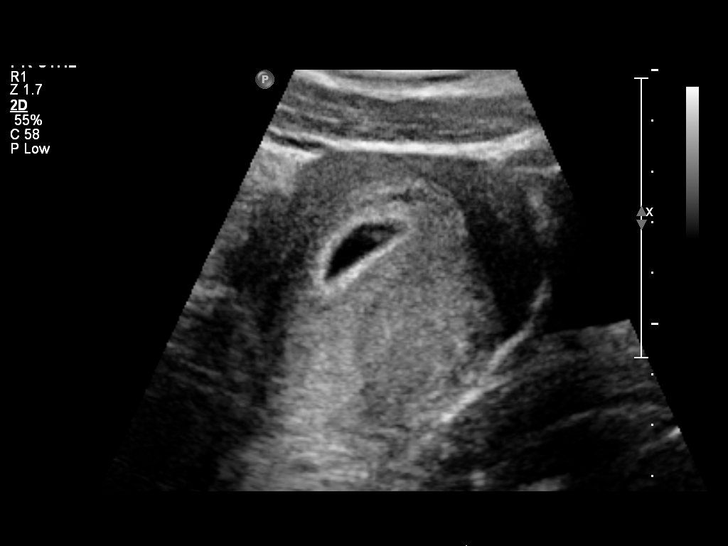

[14 of 19 positions shown; findings below may reference images not displayed]

Intrauterine gestational sac: There is a single intrauterine
gestation. A small crescent of low attenuation adjacent to the
gestational sac suggesting a tiny subchorionic hemorrhage.
Yolk sac: Yolk sac is visualized.
Embryo: Fetal pole is visualized.
Cardiac Activity: Fetal cardiac activity is visualized.
Heart Rate: 60 bpm

CRL:  3.1 mm  6 w  0 d        US EDC: 01/02/2013

Maternal uterus/Adnexae:
The uterus is anteverted.  No myometrial masses are demonstrated.
Both ovaries are visualized and appear symmetrical.  No abnormal
adnexal masses.  No free pelvic fluid collections identified.
IMPRESSION: Single intrauterine gestational sac.  Estimated gestational age by
crown-rump length is 6 weeks 0 days.  Possible minimal subchorionic
hemorrhage.

## 2015-01-09 ENCOUNTER — Telehealth: Payer: Self-pay | Admitting: Internal Medicine

## 2015-01-09 NOTE — Telephone Encounter (Signed)
Call to patient to confirm appointment for 01/10/15 at 3:15 lmtcb

## 2015-01-10 ENCOUNTER — Ambulatory Visit (INDEPENDENT_AMBULATORY_CARE_PROVIDER_SITE_OTHER): Payer: 59 | Admitting: Internal Medicine

## 2015-01-10 VITALS — BP 150/90 | HR 83 | Temp 98.6°F | Ht 66.0 in | Wt 179.9 lb

## 2015-01-10 DIAGNOSIS — E611 Iron deficiency: Secondary | ICD-10-CM | POA: Diagnosis not present

## 2015-01-10 DIAGNOSIS — R196 Halitosis: Secondary | ICD-10-CM

## 2015-01-10 DIAGNOSIS — I10 Essential (primary) hypertension: Secondary | ICD-10-CM

## 2015-01-10 DIAGNOSIS — R030 Elevated blood-pressure reading, without diagnosis of hypertension: Secondary | ICD-10-CM

## 2015-01-10 DIAGNOSIS — D509 Iron deficiency anemia, unspecified: Secondary | ICD-10-CM | POA: Diagnosis not present

## 2015-01-10 DIAGNOSIS — G4719 Other hypersomnia: Secondary | ICD-10-CM

## 2015-01-10 MED ORDER — NYSTATIN 100000 UNIT/ML MT SUSP: 5.0000 mL | Freq: Four times a day (QID) | OROMUCOSAL | Status: DC

## 2015-01-10 NOTE — Patient Instructions (Signed)
Rhonda Bird it was nice meeting you today. Please use Nystatin suspension: swish and spit 5 ml 4 times daily. I have ordered some labs for you today and you will be informed when the results come back. Please return for a follow up visit in 1 month.

## 2015-01-11 ENCOUNTER — Encounter: Payer: Self-pay | Admitting: Internal Medicine

## 2015-01-11 DIAGNOSIS — I10 Essential (primary) hypertension: Secondary | ICD-10-CM

## 2015-01-11 HISTORY — DX: Essential (primary) hypertension: I10

## 2015-01-11 LAB — CBC
HEMATOCRIT: 38.6 % (ref 34.0–46.6)
Hemoglobin: 12.3 g/dL (ref 11.1–15.9)
MCH: 28.3 pg (ref 26.6–33.0)
MCHC: 31.9 g/dL (ref 31.5–35.7)
MCV: 89 fL (ref 79–97)
Platelets: 258 10*3/uL (ref 150–379)
RBC: 4.35 x10E6/uL (ref 3.77–5.28)
RDW: 16 % — ABNORMAL HIGH (ref 12.3–15.4)
WBC: 7.6 10*3/uL (ref 3.4–10.8)

## 2015-01-11 LAB — BASIC METABOLIC PANEL
BUN/Creatinine Ratio: 10 (ref 8–20)
BUN: 8 mg/dL (ref 6–20)
CALCIUM: 8.9 mg/dL (ref 8.7–10.2)
CHLORIDE: 100 mmol/L (ref 97–108)
CO2: 22 mmol/L (ref 18–29)
Creatinine, Ser: 0.81 mg/dL (ref 0.57–1.00)
GFR calc Af Amer: 114 mL/min/{1.73_m2} (ref >59)
GFR, EST NON AFRICAN AMERICAN: 98 mL/min/{1.73_m2} (ref >59)
Glucose: 117 mg/dL — ABNORMAL HIGH (ref 65–99)
POTASSIUM: 3.9 mmol/L (ref 3.5–5.2)
Sodium: 140 mmol/L (ref 134–144)

## 2015-01-11 LAB — FERRITIN: FERRITIN: 10 ng/mL — AB (ref 15–150)

## 2015-01-11 NOTE — Assessment & Plan Note (Addendum)
Persistent halitosis for several years despite good oral hygiene and trying several home remedies. It is not a/w with ingestion of any particular food type. Patient states she is a nose breather. States does mention having a white coating on her tongue that she scrapes off several times a day when brushing teeth. As such, it was not noticed during the physical exam. Halitosis most likely secondary to oral thrush. Zenker's diverticulum not likely because patient is young and denies regurgitating any food.Not likely GI related because she does not have GERD. Not likely URT related because she does not have any sinus problems and tonsils were removed years ago.  -Nystatin swish and spit 4 times daily -Reassess at f/u visit

## 2015-01-11 NOTE — Assessment & Plan Note (Addendum)
Patient was told by her gynecologist that she had low iron back in March 2016. She is new to our clinic. -F/u CBC and Ferritin

## 2015-01-11 NOTE — Assessment & Plan Note (Signed)
Patient states she stopped taking Nuvigil a year ago and not had any problems since then. Denies any excessive sleepiness or problem driving.

## 2015-01-11 NOTE — Assessment & Plan Note (Addendum)
Patient has a history of gestational hypertension. As per patient, she had post partum HTN in 2015 but never took the medication that was prescribed to her. BP today is 150/90. -Recheck BP at next visit. If it continues to be elevated, consider starting patient on a medication.  -F/u BMP

## 2015-01-11 NOTE — Progress Notes (Signed)
Patient ID: Rhonda Bird, female   DOB: [birth year], 29 y.o.   MRN: [id number]   Subjective:   Patient ID: Rhonda Bird female   DOB: 24-Dec-1985 29 y.o.   MRN: [id number]  HPI: [name] is a 29 y.o. F [id number] with a PMHx of gestational HTN and narcolepsy presenting to the clinic today to establish care. Patient states she went to her Gynecologist back in March, 2016 and was told her ion was low. States she had high blood pressure during both her pregnancies - she was in the ICU during her first pregnancy in 2006 and had post partum HTN during her 2nd pregnancy in 2015 for which she was told to take a BP medication at home but she never did. States 2 weeks after her delivery she was told by her gynecologist that her BP was okay and that she didn't need to take any medication.  Patient's primary concern today is halitosis. States she has had bad breath for several years but it has gotten worse this year despite having good oral hygiene and visiting a dentist every 3-6 months. As per patient, she notices a white coating on her tongue and uses a tongue scraper daily. States she has tried several different remedies including mouthwashes, probiotics, vegetarian diets, and colon cleanse enemas but nothing has helped. States her breath smells bad all the time and the only thing that helps mask the odor is eating food. Bad breath not made worse by eating any particular types of food. Denies GERD, no regurgitation of food. No history of sinus problems. States her tonsils were removed years ago.   Past Medical History  Diagnosis Date  . Pregnancy induced hypertension     first preg  . Vaginal Pap smear, abnormal     cryo, ok since  . Narcolepsy     per prenatal   Current Outpatient Prescriptions  Medication Sig Dispense Refill  . acetaminophen (TYLENOL) 500 MG tablet Take 500 mg by mouth every 6 (six) hours as needed for headache.    Marland Kitchen CAMRESE 0.15-0.03 &0.01 MG  tablet Take 1 tablet by mouth daily.  4  . hydrochlorothiazide (HYDRODIURIL) 25 MG tablet Take 1 tablet (25 mg total) by mouth daily. (Patient not taking: Reported on 01/10/2015) 7 tablet 0  . ibuprofen (ADVIL,MOTRIN) 600 MG tablet Take 1 tablet (600 mg total) by mouth every 6 (six) hours. 30 tablet 1  . nystatin (MYCOSTATIN) 100000 UNIT/ML suspension Take 5 mLs (500,000 Units total) by mouth 4 (four) times daily. Please swish and spit. 240 mL 0  . Prenatal Vit-Fe Fumarate-FA (PRENATAL MULTIVITAMIN) TABS tablet Take 1 tablet by mouth daily.     No current facility-administered medications for this visit.   Family History  Problem Relation Age of Onset  . Adopted: Yes  . Other Neg Hx    Social History   Social History  . Marital Status: Single    Spouse Name: N/A  . Number of Children: N/A  . Years of Education: N/A   Social History Main Topics  . Smoking status: Never Smoker   . Smokeless tobacco: Never Used  . Alcohol Use: No     Comment: socially  . Drug Use: No  . Sexual Activity: Yes    Birth Control/ Protection: None   Other Topics Concern  . Not on file   Social History Narrative   Review of Systems: Review of Systems  Constitutional: Negative for fever, chills and malaise/fatigue.  HENT: Negative for  ear pain.   Eyes: Negative for blurred vision and pain.  Respiratory: Negative for cough, sputum production, shortness of breath and wheezing.   Cardiovascular: Negative for chest pain, palpitations and leg swelling.  Gastrointestinal: Negative for nausea, vomiting, abdominal pain, diarrhea and constipation.  Genitourinary: Negative for dysuria, urgency and frequency.  Musculoskeletal: Negative for myalgias, back pain and joint pain.  Skin: Negative for itching and rash.  Neurological: Negative for dizziness, sensory change, focal weakness, loss of consciousness and headaches.     Objective:  Physical Exam: Filed Vitals:   01/10/15 1519 01/10/15 1608  BP: 151/91  150/90  Pulse: 83 83  Temp: 98.6 F (37 C)   TempSrc: Oral   Height: 5\' 6"  (1.676 m)   Weight: 179 lb 14.4 oz (81.602 kg)   SpO2: 100%    Physical Exam  Constitutional: She is oriented to person, place, and time. She appears well-developed and well-nourished. No distress.  HENT:  Head: Normocephalic and atraumatic.  Mouth/Throat: No oropharyngeal exudate.  Eyes: EOM are normal. Pupils are equal, round, and reactive to light.  Neck: Neck supple. No tracheal deviation present.  Cardiovascular: Normal rate, regular rhythm, normal heart sounds and intact distal pulses.   Pulmonary/Chest: Effort normal and breath sounds normal. No respiratory distress. She has no wheezes. She has no rales.  Abdominal: Soft. Bowel sounds are normal. She exhibits no distension. There is no tenderness.  Musculoskeletal: Normal range of motion. She exhibits no edema or tenderness.  Lymphadenopathy:    She has no cervical adenopathy.  Neurological: She is alert and oriented to person, place, and time. No cranial nerve deficit.  Skin: Skin is warm and dry.  Psychiatric: She has a normal mood and affect. Her behavior is normal.     Assessment & Plan:

## 2015-01-14 ENCOUNTER — Telehealth: Payer: Self-pay | Admitting: Internal Medicine

## 2015-01-14 NOTE — Telephone Encounter (Signed)
Patient's labs from 01/11/15 showing H/H 12.3/ 38.6. Ferritin is low (10). I called the patient to discuss the lab results. Patient is asymptomatic and does not need any iron supplementation at this time. I suggested she incorporate red meats, beans, and leafy greens into her diet. The plan is to recheck her CBC in 6 months. Patient expressed her understanding and agreed with the plan.

## 2015-01-14 NOTE — Progress Notes (Signed)
Internal Medicine Clinic Attending  I saw and evaluated the patient.  I personally confirmed the key portions of the history and exam documented by Dr. Rathore and I reviewed pertinent patient test results.  The assessment, diagnosis, and plan were formulated together and I agree with the documentation in the resident's note.  

## 2015-01-24 ENCOUNTER — Other Ambulatory Visit: Payer: Self-pay | Admitting: Internal Medicine

## 2015-01-24 DIAGNOSIS — R196 Halitosis: Secondary | ICD-10-CM

## 2015-01-24 NOTE — Telephone Encounter (Signed)
Patient Requesting Nystatin Refill @ the [facility] on [address].

## 2015-01-24 NOTE — Telephone Encounter (Signed)
Pt back states yes this is the medication that she was only given 2 weeks of the medication

## 2015-01-24 NOTE — Telephone Encounter (Signed)
Nystatin was last refilled on the 11th. I called pt to if she is out of this medication and needs a refill but no answer; message left.

## 2015-01-24 NOTE — Telephone Encounter (Signed)
Refill request sent to Dr Marlowe Sax.

## 2015-01-25 NOTE — Telephone Encounter (Signed)
Pt called requesting Nystatin to be filled. Please call pt back.

## 2015-01-28 MED ORDER — NYSTATIN 100000 UNIT/ML MT SUSP: 5.0000 mL | Freq: Four times a day (QID) | OROMUCOSAL | Status: DC

## 2015-01-28 NOTE — Telephone Encounter (Signed)
Refilled today. Thanks.

## 2015-02-05 ENCOUNTER — Encounter: Payer: 59 | Admitting: Internal Medicine

## 2015-02-05 ENCOUNTER — Encounter: Payer: Self-pay | Admitting: Internal Medicine

## 2015-08-19 ENCOUNTER — Telehealth: Payer: Self-pay | Admitting: Internal Medicine

## 2015-08-19 NOTE — Telephone Encounter (Signed)
APPT. REMINDER CALL, LMTCB °

## 2015-08-20 ENCOUNTER — Telehealth: Payer: Self-pay | Admitting: Internal Medicine

## 2015-08-20 ENCOUNTER — Ambulatory Visit: Payer: 59 | Admitting: Internal Medicine

## 2015-08-20 NOTE — Telephone Encounter (Signed)
APPT. REMINDER CALL, LMTCB °

## 2015-08-21 ENCOUNTER — Encounter: Payer: Self-pay | Admitting: Internal Medicine

## 2015-08-21 ENCOUNTER — Ambulatory Visit (INDEPENDENT_AMBULATORY_CARE_PROVIDER_SITE_OTHER): Payer: 59 | Admitting: Internal Medicine

## 2015-08-21 VITALS — BP 141/95 | HR 80 | Temp 98.1°F | Ht 66.0 in | Wt 178.3 lb

## 2015-08-21 DIAGNOSIS — L659 Nonscarring hair loss, unspecified: Secondary | ICD-10-CM | POA: Insufficient documentation

## 2015-08-21 DIAGNOSIS — N898 Other specified noninflammatory disorders of vagina: Secondary | ICD-10-CM | POA: Insufficient documentation

## 2015-08-21 DIAGNOSIS — R196 Halitosis: Secondary | ICD-10-CM

## 2015-08-21 DIAGNOSIS — Z0000 Encounter for general adult medical examination without abnormal findings: Secondary | ICD-10-CM

## 2015-08-21 MED ORDER — CLOTRIMAZOLE 10 MG MT TROC: 10.0000 mg | Freq: Three times a day (TID) | OROMUCOSAL | Status: DC

## 2015-08-21 NOTE — Progress Notes (Signed)
Subjective:   Patient ID: Rhonda Bird female   DOB: 05-21-1986 30 y.o.   MRN: [id number]  HPI: Rhonda Bird is a 30 y.o. female w/ PMHx of HTN, presents to the clinic today for an acute visit for hair loss. Patient says this has been happening for a few months, now, typically she feels like her hair is thinning but also says she feels like her hair is falling out in clumps at times as well. Also admits to recent fatigue. Take OCP's (combination estrogen/progesterone) and has for some time. Not currently taking any other medications. Does not note any hirsutism, voice changes, or changes in hair growth otherwise.   Patient also states that she recently had unprotected sex and has noticed a small amount of discharge, but can't tell if she is just hypersensitive and this is only her physiological discharge. Has her period every 3 months based on her OCP's and states she has not missed a dose. No recent nausea or vomiting.   Patient also states she has a h/o thrush, was previously given course of Nystatin swish and swallow and says this helped significantly, however, she claims this has returned. Says she has noticed white patches in her mouth and on her tongue over the past week or so and has been hypervigilant about brushing her teeth and using mouth wash because she is embarrassed about her breath.   Past Medical History  Diagnosis Date  . Pregnancy induced hypertension     first preg  . Vaginal Pap smear, abnormal     cryo, ok since  . Narcolepsy     per prenatal  . High blood pressure 01/11/2015   Current Outpatient Prescriptions  Medication Sig Dispense Refill  . CAMRESE 0.15-0.03 &0.01 MG tablet Take 1 tablet by mouth daily.  4  . clotrimazole (MYCELEX) 10 MG troche Take 1 tablet (10 mg total) by mouth 3 (three) times daily. 35 tablet 0   No current facility-administered medications for this visit.    Review of Systems: General: Positive for fatigue, and  hair loss/breakage. Denies fever, chills, diaphoresis, appetite change.  Respiratory: Denies SOB, DOE, cough, and wheezing.   Cardiovascular: Denies chest pain and palpitations.  Gastrointestinal: Denies nausea, vomiting, abdominal pain, and diarrhea.  Genitourinary: Denies dysuria, increased frequency, and flank pain. Endocrine: Denies hot or cold intolerance, polyuria, and polydipsia. Musculoskeletal: Denies myalgias, back pain, joint swelling, arthralgias and gait problem.  Skin: Denies pallor, rash and wounds.  Neurological: Denies dizziness, seizures, syncope, weakness, lightheadedness, numbness and headaches.  Psychiatric/Behavioral: Denies mood changes, and sleep disturbances.  Objective:   Physical Exam: Filed Vitals:   08/21/15 1601  BP: 141/95  Pulse: 80  Temp: 98.1 F (36.7 C)  TempSrc: Oral  Height: 5\' 6"  (1.676 m)  Weight: 178 lb 4.8 oz (80.876 kg)  SpO2: 100%    General: Alert, cooperative, NAD. HEENT: PERRL, EOMI. Moist mucus membranes. No apparent thinning of hair seen, possibly in the temporal regions bilaterally.  Neck: Full range of motion without pain, supple, no lymphadenopathy or carotid bruits Lungs: Clear to ascultation bilaterally, normal work of respiration, no wheezes, rales, rhonchi Heart: RRR, no murmurs, gallops, or rubs Abdomen: Soft, non-tender, non-distended, BS + GU: Pelvic exam with no tenderness, scant discharge seen, no erythema or vulvar lesions. Cervix appears normal, no obvious lesions.  Extremities: No cyanosis, clubbing, or edema Neurologic: Alert & oriented X3, cranial nerves II-XII intact, strength grossly intact, sensation intact to light touch   Assessment &  Plan:   Please see problem based assessment and plan.

## 2015-08-21 NOTE — Patient Instructions (Signed)
1. Please make a follow up for 4 weeks.   2. Please take all medications as previously prescribed.  Start using Mycelex wafers 3 times daily for possible thrush.   Keep using good oral hygiene.   I will call you with your lab results.   3. If you have worsening of your symptoms or new symptoms arise, please call the clinic [phone]), or go to the ER immediately if symptoms are severe.

## 2015-08-22 LAB — CBC WITH DIFFERENTIAL/PLATELET
Basophils Absolute: 0 10*3/uL (ref 0.0–0.2)
Basos: 0 %
EOS (ABSOLUTE): 0 10*3/uL (ref 0.0–0.4)
EOS: 0 %
HEMOGLOBIN: 13.9 g/dL (ref 11.1–15.9)
Hematocrit: 41.3 % (ref 34.0–46.6)
Immature Grans (Abs): 0 10*3/uL (ref 0.0–0.1)
Immature Granulocytes: 0 %
LYMPHS ABS: 2.7 10*3/uL (ref 0.7–3.1)
Lymphs: 34 %
MCH: 29.6 pg (ref 26.6–33.0)
MCHC: 33.7 g/dL (ref 31.5–35.7)
MCV: 88 fL (ref 79–97)
MONOCYTES: 5 %
Monocytes Absolute: 0.4 10*3/uL (ref 0.1–0.9)
NEUTROS ABS: 4.8 10*3/uL (ref 1.4–7.0)
Neutrophils: 61 %
Platelets: 240 10*3/uL (ref 150–379)
RBC: 4.7 x10E6/uL (ref 3.77–5.28)
RDW: 14.6 % (ref 12.3–15.4)
WBC: 7.9 10*3/uL (ref 3.4–10.8)

## 2015-08-22 LAB — TSH: TSH: 2.41 u[IU]/mL (ref 0.450–4.500)

## 2015-08-22 LAB — CERVICOVAGINAL ANCILLARY ONLY
Chlamydia: NEGATIVE
NEISSERIA GONORRHEA: NEGATIVE

## 2015-08-22 LAB — FERRITIN: Ferritin: 51 ng/mL (ref 15–150)

## 2015-08-22 LAB — HIV ANTIBODY (ROUTINE TESTING W REFLEX): HIV Screen 4th Generation wRfx: NONREACTIVE

## 2015-08-22 NOTE — Progress Notes (Signed)
Internal Medicine Clinic Attending  Case discussed with Dr. Jones at the time of the visit.  We reviewed the resident's history and exam and pertinent patient test results.  I agree with the assessment, diagnosis, and plan of care documented in the resident's note.  

## 2015-08-22 NOTE — Assessment & Plan Note (Signed)
Patient had recent unprotected sex (once), says she wants to be checked for STD's. Claims she is having scant vaginal discharge and thinks it might be her regular physiologic discharge but isn't sure. No dysuria, pain, fever, chills, nausea, or vomiting. Says she has not missed any doses of her OCP's.  -GC/Chlamydia negative, HIV negative -Wet prep pending

## 2015-08-22 NOTE — Assessment & Plan Note (Signed)
Claims she has had hair loss for the period of a few months. States she has had breakage of strands, thinning, and sometimes loss of hair in small clumps. On exam, no clear thinning seen, possibly in the temporal region bilaterally, but not significant. TSH, CBC, and ferritin, all within normal limits. Suspect this is mild telogen effluvium related to recent child birth (2 years ago) followed by OCP use. Currently using oral contraceptives. Patient denies any recent emotional stressors, does not pull her hair out. No other medications.  -Reassurance -Discussed other forms of contraception

## 2015-08-22 NOTE — Assessment & Plan Note (Signed)
Patient is sure that she has oral thrush and has been treated for this in the past with Nystatin swish. Denies any throat pain. States that she has recently noticed white patches in her mouth and tongue that are painful. She has been hypersensitive about her oral hygiene and has been brushing her teeth and using mouth wash regularly because she is embarrassed about her breath. On exam, no apparent thrush. HIV negative. Do not see any reason why she should have oral thrush.  -Will give Rx for Mycelex troches. If truly thrush, shown to be more effective than Nystatin.  -Discussed adequate hydration, regular oral hygiene, dental follow up

## 2015-08-22 NOTE — Assessment & Plan Note (Signed)
Pap smear performed, results pending

## 2015-08-23 LAB — CYTOLOGY - PAP

## 2015-08-23 LAB — CERVICOVAGINAL ANCILLARY ONLY: WET PREP (BD AFFIRM): NEGATIVE

## 2015-09-30 ENCOUNTER — Telehealth: Payer: Self-pay | Admitting: Internal Medicine

## 2015-09-30 NOTE — Telephone Encounter (Signed)
APPT. REMINDER CALL, LMTCB °

## 2015-10-01 ENCOUNTER — Encounter: Payer: 59 | Admitting: Internal Medicine

## 2015-10-02 ENCOUNTER — Encounter: Payer: Self-pay | Admitting: Internal Medicine

## 2016-04-27 ENCOUNTER — Telehealth: Payer: Self-pay | Admitting: Internal Medicine

## 2016-04-27 NOTE — Telephone Encounter (Signed)
APT. REMINDER CALL, LMTCB °

## 2016-04-28 ENCOUNTER — Encounter: Payer: Self-pay | Admitting: Internal Medicine

## 2016-04-28 ENCOUNTER — Encounter: Payer: 59 | Admitting: Internal Medicine

## 2016-08-05 ENCOUNTER — Encounter: Payer: Self-pay | Admitting: Women's Health

## 2016-08-05 ENCOUNTER — Ambulatory Visit (INDEPENDENT_AMBULATORY_CARE_PROVIDER_SITE_OTHER): Payer: 59 | Admitting: Women's Health

## 2016-08-05 VITALS — BP 154/92 | Ht 67.0 in | Wt 183.0 lb

## 2016-08-05 DIAGNOSIS — Z3041 Encounter for surveillance of contraceptive pills: Secondary | ICD-10-CM | POA: Diagnosis not present

## 2016-08-05 DIAGNOSIS — Z113 Encounter for screening for infections with a predominantly sexual mode of transmission: Secondary | ICD-10-CM | POA: Diagnosis not present

## 2016-08-05 DIAGNOSIS — Z01419 Encounter for gynecological examination (general) (routine) without abnormal findings: Secondary | ICD-10-CM | POA: Diagnosis not present

## 2016-08-05 DIAGNOSIS — N898 Other specified noninflammatory disorders of vagina: Secondary | ICD-10-CM

## 2016-08-05 DIAGNOSIS — L298 Other pruritus: Secondary | ICD-10-CM

## 2016-08-05 DIAGNOSIS — A5901 Trichomonal vulvovaginitis: Secondary | ICD-10-CM

## 2016-08-05 LAB — CBC WITH DIFFERENTIAL/PLATELET
Basophils Absolute: 0 cells/uL (ref 0–200)
Basophils Relative: 0 %
Eosinophils Absolute: 78 cells/uL (ref 15–500)
Eosinophils Relative: 1 %
HCT: 43 % (ref 35.0–45.0)
HEMOGLOBIN: 14 g/dL (ref 11.7–15.5)
LYMPHS ABS: 2808 {cells}/uL (ref 850–3900)
Lymphocytes Relative: 36 %
MCH: 30 pg (ref 27.0–33.0)
MCHC: 32.6 g/dL (ref 32.0–36.0)
MCV: 92.1 fL (ref 80.0–100.0)
MONO ABS: 390 {cells}/uL (ref 200–950)
MONOS PCT: 5 %
MPV: 10.9 fL (ref 7.5–12.5)
Neutro Abs: 4524 cells/uL (ref 1500–7800)
Neutrophils Relative %: 58 %
Platelets: 243 10*3/uL (ref 140–400)
RBC: 4.67 MIL/uL (ref 3.80–5.10)
RDW: 14.4 % (ref 11.0–15.0)
WBC: 7.8 10*3/uL (ref 3.8–10.8)

## 2016-08-05 LAB — COMPREHENSIVE METABOLIC PANEL
ALT: 12 U/L (ref 6–29)
AST: 15 U/L (ref 10–30)
Albumin: 4.4 g/dL (ref 3.6–5.1)
Alkaline Phosphatase: 46 U/L (ref 33–115)
BUN: 8 mg/dL (ref 7–25)
CO2: 21 mmol/L (ref 20–31)
CREATININE: 0.9 mg/dL (ref 0.50–1.10)
Calcium: 9.4 mg/dL (ref 8.6–10.2)
Chloride: 104 mmol/L (ref 98–110)
Glucose, Bld: 83 mg/dL (ref 65–99)
POTASSIUM: 4 mmol/L (ref 3.5–5.3)
SODIUM: 141 mmol/L (ref 135–146)
TOTAL PROTEIN: 7.5 g/dL (ref 6.1–8.1)
Total Bilirubin: 0.4 mg/dL (ref 0.2–1.2)

## 2016-08-05 LAB — WET PREP FOR TRICH, YEAST, CLUE: YEAST WET PREP: NONE SEEN

## 2016-08-05 MED ORDER — CAMRESE 0.15-0.03 &0.01 MG PO TABS: 1.0000 | ORAL_TABLET | Freq: Every day | ORAL | 0 refills | Status: DC

## 2016-08-05 MED ORDER — METRONIDAZOLE 500 MG PO TABS: ORAL_TABLET | ORAL | 1 refills | Status: DC

## 2016-08-05 NOTE — Progress Notes (Signed)
  Rhonda Bird 07/06/1985 [id number]    History:    Presents for annual exam.  Cycle every three months on Seasonique for 4-5 days. Same partner for three years,  condoms. Reports negative STD screen in the past. Partner recently negative STD screen. Complaints of vaginal discharge with itching and irritation. 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 Adopted no known family history. Blood pressure elevated today, states has had some elevations in the past, reports problems with other OCs in the past,  poor diet..  Chlamydia years ago treated.  Past medical history, past surgical history, family history and social history were all reviewed and documented in the EPIC chart. Works for [employer]. Has an 31 year old and 52-year-old both doing well.  ROS:  A ROS was performed and pertinent positives and negatives are included.  Exam:  Vitals:   08/05/16 1213  BP: (!) 154/92  Weight: 183 lb (83 kg)  Height: 5\' 7"  (1.702 m)   Body mass index is 28.66 kg/m.   General appearance:  Normal Thyroid:  Symmetrical, normal in size, without palpable masses or nodularity. Respiratory  Auscultation:  Clear without wheezing or rhonchi Cardiovascular  Auscultation:  Regular rate, without rubs, murmurs or gallops  Edema/varicosities:  Not grossly evident Abdominal  Soft,nontender, without masses, guarding or rebound.  Liver/spleen:  No organomegaly noted  Hernia:  None appreciated  Skin  Inspection:  Grossly normal   Breasts: Examined lying and sitting.     Right: Without masses, retractions, discharge or axillary adenopathy.     Left: Without masses, retractions, discharge or axillary adenopathy. Gentitourinary   Inguinal/mons:  Normal without inguinal adenopathy  External genitalia:  Normal  BUS/Urethra/Skene's glands:  Normal  Vagina: mild erythema, wet prep positive for moderate Trichomonas Cervix:  Normal  Uterus:   normal in size, shape and contour.  Midline and  mobile  Adnexa/parametria:     Rt: Without masses or tenderness.   Lt: Without masses or tenderness.  Anus and perineum: Normal  Digital rectal exam: Normal sphincter tone without palpated masses or tenderness  Assessment/Plan:  31 y.o. S WF G5 P2  for annual exam.   Cycles every 3 months on Seasonique Trichomonas STD screen Borderline blood pressure  Plan: Reports problems with other contraception 1 package of Seasonique only instructed to return to office in 3 months with report of blood pressures away from office. Other options of Micronor, IUDs reviewed declines. Flagyl 2 g by mouth today refill for partner. Alcohol precautions reviewed. Reviewed most likely sexually transmitted. DASH diet eating reviewed, encourage to decrease salt laden foods. CBC, CMP, Pap per request with GC and Chlamydia, HIV, hep B, C, RPR. Pap normal with negative HR HPV 2017.    Huel Cote WHNP, 1:06 PM 08/05/2016

## 2016-08-05 NOTE — Addendum Note (Signed)
Addended by: Thurnell Garbe A on: 08/05/2016 02:31 PM   Modules accepted: Orders

## 2016-08-05 NOTE — Patient Instructions (Addendum)
Health Maintenance, Female Adopting a healthy lifestyle and getting preventive care can go a long way to promote health and wellness. Talk with your health care provider about what schedule of regular examinations is right for you. This is a good chance for you to check in with your provider about disease prevention and staying healthy. In between checkups, there are plenty of things you can do on your own. Experts have done a lot of research about which lifestyle changes and preventive measures are most likely to keep you healthy. Ask your health care provider for more information. Weight and diet Eat a healthy diet  Be sure to include plenty of vegetables, fruits, low-fat dairy products, and lean protein.  Do not eat a lot of foods high in solid fats, added sugars, or salt.  Get regular exercise. This is one of the most important things you can do for your health.  Most adults should exercise for at least 150 minutes each week. The exercise should increase your heart rate and make you sweat (moderate-intensity exercise).  Most adults should also do strengthening exercises at least twice a week. This is in addition to the moderate-intensity exercise. Maintain a healthy weight  Body mass index (BMI) is a measurement that can be used to identify possible weight problems. It estimates body fat based on height and weight. Your health care provider can help determine your BMI and help you achieve or maintain a healthy weight.  For females 76 years of age and older:  A BMI below 18.5 is considered underweight.  A BMI of 18.5 to 24.9 is normal.  A BMI of 25 to 29.9 is considered overweight.  A BMI of 30 and above is considered obese. Watch levels of cholesterol and blood lipids  You should start having your blood tested for lipids and cholesterol at 31 years of age, then have this test every 5 years.  You may need to have your cholesterol levels checked more often if:  Your lipid or  cholesterol levels are high.  You are older than 31 years of age.  You are at high risk for heart disease. Cancer screening Lung Cancer  Lung cancer screening is recommended for adults 64-42 years old who are at high risk for lung cancer because of a history of smoking.  A yearly low-dose CT scan of the lungs is recommended for people who:  Currently smoke.  Have quit within the past 15 years.  Have at least a 30-pack-year history of smoking. A pack year is smoking an average of one pack of cigarettes a day for 1 year.  Yearly screening should continue until it has been 15 years since you quit.  Yearly screening should stop if you develop a health problem that would prevent you from having lung cancer treatment. Breast Cancer  Practice breast self-awareness. This means understanding how your breasts normally appear and feel.  It also means doing regular breast self-exams. Let your health care provider know about any changes, no matter how small.  If you are in your 20s or 30s, you should have a clinical breast exam (CBE) by a health care provider every 1-3 years as part of a regular health exam.  If you are 34 or older, have a CBE every year. Also consider having a breast X-ray (mammogram) every year.  If you have a family history of breast cancer, talk to your health care provider about genetic screening.  If you are at high risk for breast cancer, talk  to your health care provider about having an MRI and a mammogram every year.  Breast cancer gene (BRCA) assessment is recommended for women who have family members with BRCA-related cancers. BRCA-related cancers include:  Breast.  Ovarian.  Tubal.  Peritoneal cancers.  Results of the assessment will determine the need for genetic counseling and BRCA1 and BRCA2 testing. Cervical Cancer  Your health care provider may recommend that you be screened regularly for cancer of the pelvic organs (ovaries, uterus, and vagina).  This screening involves a pelvic examination, including checking for microscopic changes to the surface of your cervix (Pap test). You may be encouraged to have this screening done every 3 years, beginning at age 24.  For women ages 66-65, health care providers may recommend pelvic exams and Pap testing every 3 years, or they may recommend the Pap and pelvic exam, combined with testing for human papilloma virus (HPV), every 5 years. Some types of HPV increase your risk of cervical cancer. Testing for HPV may also be done on women of any age with unclear Pap test results.  Other health care providers may not recommend any screening for nonpregnant women who are considered low risk for pelvic cancer and who do not have symptoms. Ask your health care provider if a screening pelvic exam is right for you.  If you have had past treatment for cervical cancer or a condition that could lead to cancer, you need Pap tests and screening for cancer for at least 20 years after your treatment. If Pap tests have been discontinued, your risk factors (such as having a new sexual partner) need to be reassessed to determine if screening should resume. Some women have medical problems that increase the chance of getting cervical cancer. In these cases, your health care provider may recommend more frequent screening and Pap tests. Colorectal Cancer  This type of cancer can be detected and often prevented.  Routine colorectal cancer screening usually begins at 31 years of age and continues through 31 years of age.  Your health care provider may recommend screening at an earlier age if you have risk factors for colon cancer.  Your health care provider may also recommend using home test kits to check for hidden blood in the stool.  A small camera at the end of a tube can be used to examine your colon directly (sigmoidoscopy or colonoscopy). This is done to check for the earliest forms of colorectal cancer.  Routine  screening usually begins at age 41.  Direct examination of the colon should be repeated every 5-10 years through 31 years of age. However, you may need to be screened more often if early forms of precancerous polyps or small growths are found. Skin Cancer  Check your skin from head to toe regularly.  Tell your health care provider about any new moles or changes in moles, especially if there is a change in a mole's shape or color.  Also tell your health care provider if you have a mole that is larger than the size of a pencil eraser.  Always use sunscreen. Apply sunscreen liberally and repeatedly throughout the day.  Protect yourself by wearing long sleeves, pants, a wide-brimmed hat, and sunglasses whenever you are outside. Heart disease, diabetes, and high blood pressure  High blood pressure causes heart disease and increases the risk of stroke. High blood pressure is more likely to develop in:  People who have blood pressure in the high end of the normal range (130-139/85-89 mm Hg).  People who are overweight or obese.  People who are African American.  If you are 59-24 years of age, have your blood pressure checked every 3-5 years. If you are 34 years of age or older, have your blood pressure checked every year. You should have your blood pressure measured twice-once when you are at a hospital or clinic, and once when you are not at a hospital or clinic. Record the average of the two measurements. To check your blood pressure when you are not at a hospital or clinic, you can use:  An automated blood pressure machine at a pharmacy.  A home blood pressure monitor.  If you are between 29 years and 60 years old, ask your health care provider if you should take aspirin to prevent strokes.  Have regular diabetes screenings. This involves taking a blood sample to check your fasting blood sugar level.  If you are at a normal weight and have a low risk for diabetes, have this test once  every three years after 31 years of age.  If you are overweight and have a high risk for diabetes, consider being tested at a younger age or more often. Preventing infection Hepatitis B  If you have a higher risk for hepatitis B, you should be screened for this virus. You are considered at high risk for hepatitis B if:  You were born in a country where hepatitis B is common. Ask your health care provider which countries are considered high risk.  Your parents were born in a high-risk country, and you have not been immunized against hepatitis B (hepatitis B vaccine).  You have HIV or AIDS.  You use needles to inject street drugs.  You live with someone who has hepatitis B.  You have had sex with someone who has hepatitis B.  You get hemodialysis treatment.  You take certain medicines for conditions, including cancer, organ transplantation, and autoimmune conditions. Hepatitis C  Blood testing is recommended for:  Everyone born from [birth year] through 1965.  Anyone with known risk factors for hepatitis C. Sexually transmitted infections (STIs)  You should be screened for sexually transmitted infections (STIs) including gonorrhea and chlamydia if:  You are sexually active and are younger than 31 years of age.  You are older than 31 years of age and your health care provider tells you that you are at risk for this type of infection.  Your sexual activity has changed since you were last screened and you are at an increased risk for chlamydia or gonorrhea. Ask your health care provider if you are at risk.  If you do not have HIV, but are at risk, it may be recommended that you take a prescription medicine daily to prevent HIV infection. This is called pre-exposure prophylaxis (PrEP). You are considered at risk if:  You are sexually active and do not regularly use condoms or know the HIV status of your partner(s).  You take drugs by injection.  You are sexually active with a partner  who has HIV. Talk with your health care provider about whether you are at high risk of being infected with HIV. If you choose to begin PrEP, you should first be tested for HIV. You should then be tested every 3 months for as long as you are taking PrEP. Pregnancy  If you are premenopausal and you may become pregnant, ask your health care provider about preconception counseling.  If you may become pregnant, take 400 to 800 micrograms (mcg) of folic acid  every day.  If you want to prevent pregnancy, talk to your health care provider about birth control (contraception). Osteoporosis and menopause  Osteoporosis is a disease in which the bones lose minerals and strength with aging. This can result in serious bone fractures. Your risk for osteoporosis can be identified using a bone density scan.  If you are 45 years of age or older, or if you are at risk for osteoporosis and fractures, ask your health care provider if you should be screened.  Ask your health care provider whether you should take a calcium or vitamin D supplement to lower your risk for osteoporosis.  Menopause may have certain physical symptoms and risks.  Hormone replacement therapy may reduce some of these symptoms and risks. Talk to your health care provider about whether hormone replacement therapy is right for you. Follow these instructions at home:  Schedule regular health, dental, and eye exams.  Stay current with your immunizations.  Do not use any tobacco products including cigarettes, chewing tobacco, or electronic cigarettes.  If you are pregnant, do not drink alcohol.  If you are breastfeeding, limit how much and how often you drink alcohol.  Limit alcohol intake to no more than 1 drink per day for nonpregnant women. One drink equals 12 ounces of beer, 5 ounces of wine, or 1 ounces of hard liquor.  Do not use street drugs.  Do not share needles.  Ask your health care provider for help if you need support  or information about quitting drugs.  Tell your health care provider if you often feel depressed.  Tell your health care provider if you have ever been abused or do not feel safe at home. This information is not intended to replace advice given to you by your health care provider. Make sure you discuss any questions you have with your health care provider. Document Released: 12/01/2010 Document Revised: 10/24/2015 Document Reviewed: 02/19/2015 Elsevier Interactive Patient Education  2017 [place] DASH stands for "Dietary Approaches to Stop Hypertension." The DASH eating plan is a healthy eating plan that has been shown to reduce high blood pressure (hypertension). It may also reduce your risk for type 2 diabetes, heart disease, and stroke. The DASH eating plan may also help with weight loss. What are tips for following this plan? General guidelines   Avoid eating more than 2,300 mg (milligrams) of salt (sodium) a day. If you have hypertension, you may need to reduce your sodium intake to 1,500 mg a day.  Limit alcohol intake to no more than 1 drink a day for nonpregnant women and 2 drinks a day for men. One drink equals 12 oz of beer, 5 oz of wine, or 1 oz of hard liquor.  Work with your health care provider to maintain a healthy body weight or to lose weight. Ask what an ideal weight is for you.  Get at least 30 minutes of exercise that causes your heart to beat faster (aerobic exercise) most days of the week. Activities may include walking, swimming, or biking.  Work with your health care provider or diet and nutrition specialist (dietitian) to adjust your eating plan to your individual calorie needs. Reading food labels   Check food labels for the amount of sodium per serving. Choose foods with less than 5 percent of the Daily Value of sodium. Generally, foods with less than 300 mg of sodium per serving fit into this eating plan.  To find whole grains, look for  the  word "whole" as the first word in the ingredient list. Shopping   Buy products labeled as "low-sodium" or "no salt added."  Buy fresh foods. Avoid canned foods and premade or frozen meals. Cooking   Avoid adding salt when cooking. Use salt-free seasonings or herbs instead of table salt or sea salt. Check with your health care provider or pharmacist before using salt substitutes.  Do not fry foods. Cook foods using healthy methods such as baking, boiling, grilling, and broiling instead.  Cook with heart-healthy oils, such as olive, canola, soybean, or sunflower oil. Meal planning    Eat a balanced diet that includes:  5 or more servings of fruits and vegetables each day. At each meal, try to fill half of your plate with fruits and vegetables.  Up to 6-8 servings of whole grains each day.  Less than 6 oz of lean meat, poultry, or fish each day. A 3-oz serving of meat is about the same size as a deck of cards. One egg equals 1 oz.  2 servings of low-fat dairy each day.  A serving of nuts, seeds, or beans 5 times each week.  Heart-healthy fats. Healthy fats called Omega-3 fatty acids are found in foods such as flaxseeds and coldwater fish, like sardines, salmon, and mackerel.  Limit how much you eat of the following:  Canned or prepackaged foods.  Food that is high in trans fat, such as fried foods.  Food that is high in saturated fat, such as fatty meat.  Sweets, desserts, sugary drinks, and other foods with added sugar.  Full-fat dairy products.  Do not salt foods before eating.  Try to eat at least 2 vegetarian meals each week.  Eat more home-cooked food and less restaurant, buffet, and fast food.  When eating at a restaurant, ask that your food be prepared with less salt or no salt, if possible. What foods are recommended? The items listed may not be a complete list. Talk with your dietitian about what dietary choices are best for you. Grains  Whole-grain or  whole-wheat bread. Whole-grain or whole-wheat pasta. Brown rice. [place]row. Bulgur. Whole-grain and low-sodium cereals. Pita bread. Low-fat, low-sodium crackers. Whole-wheat flour tortillas. Vegetables  Fresh or frozen vegetables (raw, steamed, roasted, or grilled). Low-sodium or reduced-sodium tomato and vegetable juice. Low-sodium or reduced-sodium tomato sauce and tomato paste. Low-sodium or reduced-sodium canned vegetables. Fruits  All fresh, dried, or frozen fruit. Canned fruit in natural juice (without added sugar). Meat and other protein foods  Skinless chicken or [place]. Ground chicken or [place]. Pork with fat trimmed off. Fish and seafood. Egg whites. Dried beans, peas, or lentils. Unsalted nuts, nut butters, and seeds. Unsalted canned beans. Lean cuts of beef with fat trimmed off. Low-sodium, lean deli meat. Dairy  Low-fat (1%) or fat-free (skim) milk. Fat-free, low-fat, or reduced-fat cheeses. Nonfat, low-sodium ricotta or cottage cheese. Low-fat or nonfat yogurt. Low-fat, low-sodium cheese. Fats and oils  Soft margarine without trans fats. Vegetable oil. Low-fat, reduced-fat, or light mayonnaise and salad dressings (reduced-sodium). Canola, safflower, olive, soybean, and sunflower oils. Avocado. Seasoning and other foods  Herbs. Spices. Seasoning mixes without salt. Unsalted popcorn and pretzels. Fat-free sweets. What foods are not recommended? The items listed may not be a complete list. Talk with your dietitian about what dietary choices are best for you. Grains  Baked goods made with fat, such as croissants, muffins, or some breads. Dry pasta or rice meal packs. Vegetables  Creamed or fried vegetables. Vegetables in a cheese  sauce. Regular canned vegetables (not low-sodium or reduced-sodium). Regular canned tomato sauce and paste (not low-sodium or reduced-sodium). Regular tomato and vegetable juice (not low-sodium or reduced-sodium). Angie Fava. Olives. Fruits  Canned fruit in  a light or heavy syrup. Fried fruit. Fruit in cream or butter sauce. Meat and other protein foods  Fatty cuts of meat. Ribs. Fried meat. Berniece Salines. Sausage. Bologna and other processed lunch meats. Salami. Fatback. Hotdogs. Bratwurst. Salted nuts and seeds. Canned beans with added salt. Canned or smoked fish. Whole eggs or egg yolks. Chicken or [place] with skin. Dairy  Whole or 2% milk, cream, and half-and-half. Whole or full-fat cream cheese. Whole-fat or sweetened yogurt. Full-fat cheese. Nondairy creamers. Whipped toppings. Processed cheese and cheese spreads. Fats and oils  Butter. Stick margarine. Lard. Shortening. Ghee. Bacon fat. Tropical oils, such as coconut, palm kernel, or palm oil. Seasoning and other foods  Salted popcorn and pretzels. Onion salt, garlic salt, seasoned salt, table salt, and sea salt. Worcestershire sauce. Tartar sauce. Barbecue sauce. Teriyaki sauce. Soy sauce, including reduced-sodium. Steak sauce. Canned and packaged gravies. Fish sauce. Oyster sauce. Cocktail sauce. Horseradish that you find on the shelf. Ketchup. Mustard. Meat flavorings and tenderizers. Bouillon cubes. Hot sauce and Tabasco sauce. Premade or packaged marinades. Premade or packaged taco seasonings. Relishes. Regular salad dressings. Where to find more information:  National Heart, Lung, and [place]: https://wilson-eaton.com/  American Heart Association: www.heart.org Summary  The DASH eating plan is a healthy eating plan that has been shown to reduce high blood pressure (hypertension). It may also reduce your risk for type 2 diabetes, heart disease, and stroke.  With the DASH eating plan, you should limit salt (sodium) intake to 2,300 mg a day. If you have hypertension, you may need to reduce your sodium intake to 1,500 mg a day.  When on the DASH eating plan, aim to eat more fresh fruits and vegetables, whole grains, lean proteins, low-fat dairy, and heart-healthy fats.  Work with your health care  provider or diet and nutrition specialist (dietitian) to adjust your eating plan to your individual calorie needs. This information is not intended to replace advice given to you by your health care provider. Make sure you discuss any questions you have with your health care provider. Document Released: 05/07/2011 Document Revised: 05/11/2016 Document Reviewed: 05/11/2016 Elsevier Interactive Patient Education  2017 [employer]  Cervicitis Cervicitis is irritation and swelling of the cervix. The cervix is the lower and narrow end of the uterus. It is the part of the uterus that opens up to the vagina. What are the causes? This condition may be caused by:  An STI (sexually transmitted infection), such as gonorrhea, chlamydia, or genital herpes.  Objects that are put in the vagina, such as tampons or birth control devices. This usually occurs if an object is left in for too long.  Chemical irritation or allergic reaction. This may be from vaginal douches, latex condoms, or contraceptive creams.  An injury to the cervix.  A bacterial infection.  Radiation therapy. What increases the risk? You are more likely to develop this condition if:  You have unprotected sex.  You have sex with many partners.  You have a new sexual partner.  You start having sex at an early age.  You have a history of STIs. What are the signs or symptoms? Symptoms of this condition include:  Pearline Cables, white, yellow, or bad-smelling vaginal discharge.  Pain or itchiness around the vagina.  Pain during sex.  Pain in  the lower abdomen or lower back, especially during sex.  Urinating often.  Pain during urination.  Abnormal vaginal bleeding, such as bleeding between periods, after sex, or after menopause. In some cases, there are no symptoms. How is this diagnosed? This condition may be diagnosed with:  A pelvic exam. Your health care provider will examine whether the cervix has an unusual discharge  or bleeds easily when touched with a swab.  A wet prep. This is a test in which vaginal discharge is examined under a microscope to check for signs of infection.  A swab test of the cervix. For this test, sample cells from the cervix are collected on a swab and examined under a microscope to check for signs of infection.  Urine tests. How is this treated? Treatment for cervicitis depends on what is causing the condition. Treatment may include:  Antibiotic medicines. These are used to treat certain infections, including STIs like gonorrhea or chlamydia. If you are taking these medicines to treat an STI, your sexual partner may also need to take these medicines.  Antiviral medicines. These are used to treat herpes simplex virus. Your sexual partner may also need to take these medicines.  Stopping use of items that cause irritation, such as tampons, latex condoms, douches, or spermicides. Follow these instructions at home:  Do not have sex until your health care provider says it is okay.  Take over-the-counter and prescription medicines only as told by your health care provider.  If you were prescribed an antibiotic, take it as told by your health care provider. Do not stop taking the antibiotic even if you start to feel better.  Keep all follow-up visits as told by your health care provider. This is important. Contact a health care provider if:  Your symptoms come back or get worse after treatment.  You have a fever.  You have fatigue.  You have pain in your abdomen.  You experience nausea, vomiting, or diarrhea.  You have back pain. Get help right away if:  You have severe abdominal pain that cannot be helped with medicine.  You cannot urinate. Summary  Cervicitis is irritation and swelling of the cervix.  This condition may be caused by an STI (sexually transmitted infection), an allergic reaction or chemical irritation, radiation therapy, or objects that are put in the  vagina, such as tampons or diaphragms.  Symptoms of this condition can include unusual vaginal discharge, painful urination, irritation or pain around the vagina, bleeding between periods or after sex, and pain during sex.  You are more likely to develop this condition if you have unprotected sex, have many sexual partners, or have a history of STIs.  This condition may be treated with antibiotic or antiviral medicines or by stopping use of items that cause irritation. This information is not intended to replace advice given to you by your health care provider. Make sure you discuss any questions you have with your health care provider. Document Released: 05/18/2005 Document Revised: 02/01/2016 Document Reviewed: 02/01/2016 Elsevier Interactive Patient Education  2017 [employer].

## 2016-08-06 LAB — HIV ANTIBODY (ROUTINE TESTING W REFLEX): HIV 1&2 Ab, 4th Generation: NONREACTIVE

## 2016-08-06 LAB — HEPATITIS B SURFACE ANTIGEN: HEP B S AG: NEGATIVE

## 2016-08-06 LAB — HEPATITIS C ANTIBODY: HCV AB: NEGATIVE

## 2016-08-06 LAB — RPR

## 2016-08-07 ENCOUNTER — Encounter: Payer: Self-pay | Admitting: Women's Health

## 2016-08-07 LAB — PAP IG AND CT-NG NAA
Chlamydia Probe Amp: NOT DETECTED
GC Probe Amp: NOT DETECTED

## 2016-09-16 ENCOUNTER — Ambulatory Visit: Payer: 59

## 2016-09-17 ENCOUNTER — Ambulatory Visit (INDEPENDENT_AMBULATORY_CARE_PROVIDER_SITE_OTHER): Payer: 59 | Admitting: Internal Medicine

## 2016-09-17 ENCOUNTER — Encounter: Payer: Self-pay | Admitting: Internal Medicine

## 2016-09-17 VITALS — BP 155/95 | HR 92 | Temp 97.9°F | Ht 66.0 in | Wt 188.6 lb

## 2016-09-17 DIAGNOSIS — Z820 Family history of epilepsy and other diseases of the nervous system: Secondary | ICD-10-CM | POA: Diagnosis not present

## 2016-09-17 DIAGNOSIS — R51 Headache: Secondary | ICD-10-CM | POA: Diagnosis not present

## 2016-09-17 DIAGNOSIS — I10 Essential (primary) hypertension: Secondary | ICD-10-CM | POA: Diagnosis not present

## 2016-09-17 DIAGNOSIS — G44209 Tension-type headache, unspecified, not intractable: Secondary | ICD-10-CM | POA: Insufficient documentation

## 2016-09-17 MED ORDER — LISINOPRIL 10 MG PO TABS: 10.0000 mg | ORAL_TABLET | Freq: Every day | ORAL | 2 refills | Status: DC

## 2016-09-17 NOTE — Progress Notes (Signed)
   CC: High blood pressure   HPI:  [name] is a 31 y.o. woman with PMHx as noted below who presents today with high blood pressure.  She reports she was told at her recent OBGYN visit that her BP was high. She notes a hx of high BPs for the past few years and even during her pregnancy. She reports she was prescribed a medication for hypertension but never took it. She notes the highest BP she has seen was 164/110 in February this year.  She is also complaining of headaches. She reports a 3-4 year hx of headaches that primarily in her temples but can involve the entire head. She describes them as "thumping" and "pulsating." She notes they have been occurring more frequently this past year, now almost daily. She has been using Tylenol and ibuprofen without much relief. She reports waking up in the middle of the night because the pain can be so bad. She denies any associated vision changes, sensory changes, changes in speech, weakness in her extremities, or preceding aura. She notes her headaches are interfering with work and prevent her from concentrating. She is concerned because she has an aunt who was diagnosed with a brain tumor around age 51 and had presented with headaches.   Past Medical History:  Diagnosis Date  . High blood pressure 01/11/2015  . Narcolepsy    per prenatal  . Pregnancy induced hypertension    first preg  . Vaginal Pap smear, abnormal    cryo, ok since    Review of Systems:   General: Denies fever, chills, night sweats, changes in weight, changes in appetite HEENT: Denies ear pain, rhinorrhea, sore throat CV: Denies CP, palpitations, SOB, orthopnea Pulm: Denies SOB, cough, wheezing GI: Denies abdominal pain, nausea, vomiting, diarrhea, constipation, melena, hematochezia GU: Denies dysuria, hematuria, frequency Msk: Denies muscle cramps, joint pains Neuro: See HPI Skin: Denies rashes, bruising Psych: Denies depression, anxiety,  hallucinations  Physical Exam:  Vitals:   09/17/16 0927  BP: (!) 155/95  Pulse: 92  Temp: 97.9 F (36.6 C)  TempSrc: Oral  SpO2: 100%  Weight: 188 lb 9.6 oz (85.5 kg)  Height: 5\' 6"  (1.676 m)   General: Young woman in NAD HEENT: EOMI, PERRLA, visual fields intact, sclera anicteric, mucus membranes moist CV: RRR, no m/g/r Pulm: CTA bilaterally, breaths non-labored Ext: warm, no peripheral edema, moves all extremities Neuro: alert and oriented x 3. Smile symmetric. Tongue midline. Strength 5/5 in upper and lower extremities. Sensation symmetric and intact. Gait normal.   Assessment & Plan:   See Encounters Tab for problem based charting.  Patient discussed with Dr. Beryle Beams

## 2016-09-17 NOTE — Progress Notes (Signed)
Medicine attending: Medical history, presenting problems, physical findings, and medications, reviewed with resident physician Dr Carly Rivet on the day of the patient visit and I concur with her evaluation and management plan. 

## 2016-09-17 NOTE — Assessment & Plan Note (Signed)
BP elevated at 155/95 today. Per review of records, her blood pressure has been elevated in the 742V to 956 systolic since January 3875. Will start her on lisinopril 10 mg daily. She will follow up in 2 weeks for BP recheck and bmet.

## 2016-09-17 NOTE — Patient Instructions (Signed)
General Instructions: - Start Lisinopril 10 mg daily - Your headaches are most likely related to your high blood pressure - Follow up in 2 weeks to recheck blood pressure and get labs   Please bring your medicines with you each time you come to clinic.  Medicines may include prescription medications, over-the-counter medications, herbal remedies, eye drops, vitamins, or other pills.   Progress Toward Treatment Goals:  No flowsheet data found.  Self Care Goals & Plans:  Self Care Goal 08/21/2015  Manage my medications take my medicines as prescribed; bring my medications to every visit; refill my medications on time    No flowsheet data found.   Care Management & Community Referrals:  No flowsheet data found.

## 2016-09-17 NOTE — Assessment & Plan Note (Signed)
Her headaches seem most consistent with tension type headache. Her elevated blood pressure could be contributing as well. Reassured her that it is extremely rare to have a brain tumor at her age and that she has no neurological deficits on exam. Advised her to gradually cut down on her Tylenol and ibuprofen use as this could be causing a medication overuse headache. Hopefully with treatment of her high blood pressure her headaches will improve. She brought in FMLA paperwork for her workplace which I will fill out today.

## 2016-09-22 ENCOUNTER — Other Ambulatory Visit: Payer: Self-pay | Admitting: Physician Assistant

## 2016-09-22 DIAGNOSIS — D485 Neoplasm of uncertain behavior of skin: Secondary | ICD-10-CM

## 2016-09-22 HISTORY — DX: Neoplasm of uncertain behavior of skin: D48.5

## 2016-09-30 ENCOUNTER — Telehealth: Payer: Self-pay | Admitting: Internal Medicine

## 2016-09-30 NOTE — Telephone Encounter (Signed)
APT. REMINDER CALL, LMTCB °

## 2016-10-01 ENCOUNTER — Ambulatory Visit (INDEPENDENT_AMBULATORY_CARE_PROVIDER_SITE_OTHER): Payer: 59 | Admitting: Internal Medicine

## 2016-10-01 ENCOUNTER — Encounter: Payer: Self-pay | Admitting: Internal Medicine

## 2016-10-01 VITALS — BP 134/77 | HR 71 | Temp 98.3°F | Wt 187.8 lb

## 2016-10-01 DIAGNOSIS — H1032 Unspecified acute conjunctivitis, left eye: Secondary | ICD-10-CM

## 2016-10-01 DIAGNOSIS — R42 Dizziness and giddiness: Secondary | ICD-10-CM | POA: Diagnosis not present

## 2016-10-01 DIAGNOSIS — I10 Essential (primary) hypertension: Secondary | ICD-10-CM | POA: Diagnosis not present

## 2016-10-01 MED ORDER — LOSARTAN POTASSIUM 50 MG PO TABS: 50.0000 mg | ORAL_TABLET | Freq: Every day | ORAL | 1 refills | Status: DC

## 2016-10-01 MED ORDER — LOSARTAN POTASSIUM 25 MG PO TABS: 25.0000 mg | ORAL_TABLET | Freq: Every day | ORAL | 1 refills | Status: DC

## 2016-10-01 MED ORDER — ERYTHROMYCIN 5 MG/GM OP OINT: 1.0000 "application " | TOPICAL_OINTMENT | Freq: Two times a day (BID) | OPHTHALMIC | 0 refills | Status: DC

## 2016-10-01 NOTE — Patient Instructions (Signed)
It was a pleasure seeing you today. Thank you for choosing [facility] for your healthcare needs.   I have discontinued the lisinopril and started a new medication called losartan. Take this medication once daily for the treatment of blood pressure. Please return to clinic in 1 month for blood pressure check.  If you develop any new side effects or symptoms please call the clinic to schedule an earlier appointment or to have your medication adjusted.   I have also prescribed an eyedrop to use on your left eye for the treatment of bacterial conjunctivitis.  Please return to clinic in 1 month or sooner as needed.

## 2016-10-01 NOTE — Progress Notes (Signed)
Medicine attending: Medical history, presenting problems, physical findings, and medications, reviewed with resident physician Dr Antonia Culbertson Tatlor on the day of the patient visit and I concur with his evaluation and management plan. 

## 2016-10-01 NOTE — Assessment & Plan Note (Addendum)
Patient started on lisinopril 10 mg daily on 09/17/2016 by Dr. Arcelia Jew. Plan to return to clinic in approximately 2 weeks for blood pressure check and basic metabolic panel. BP today is 134/77. Unfortunately, the patient endorses 20-30 episodes of dizziness sensation since starting the medication. On review of medication side effects this can occur in up to 18% of people. Outside of dizziness she says the blood pressure medication has also improved her headaches and she no longer has them daily. Given that she is having significant side effects on the lisinopril I will discontinue this and start losartan at 50 mg once daily. I will get a basic metabolic panel today to ensure electrolytes are within normal limits and have the patient return to clinic in 1 month -- Basic metabolic panel -- Normal clinical follow-up -- Discontinue lisinopril, start losartan 50 mg once daily

## 2016-10-01 NOTE — Progress Notes (Signed)
   CC: Dizziness and Conjunctivitis  HPI: [name] is a 31 y.o. female with a h/o of hypertension and narcolepsy who presents with dizziness after starting lisinopril and conjunctivitis of the left eye.   Review of Systems: Endorses 20-30 episodes signs of dizziness per day. Denies chest pain or shortness of breath. Denies nausea, vomiting or abdominal pain.  Physical Exam: There were no vitals filed for this visit. General appearance: alert and cooperative Lungs: clear to auscultation bilaterally Heart: regular rate and rhythm, S1, S2 normal, no murmur, click, rub or gallop Extremities: extremities normal, atraumatic, no cyanosis or edema  Assessment & Plan:  See encounters tab for problem based medical decision making. Patient discussed with Dr. Beryle Beams  Signed: Ophelia Shoulder, MD 10/01/2016, 8:57 AM  Pager: [phone]

## 2016-10-01 NOTE — Assessment & Plan Note (Signed)
Patient has acute bacterial conjunctivitis of the left eye. She has a child at home who recently had this infection and was treated with topical antibiotics. She said she used some of her child's antibiotics with improvement however she feels like she is still having symptoms. I will prescribe ophthalmic erythromycin solution to be applied to the left eye twice a day. -- Erythromycin ophthalmic ointment 1 application twice a day

## 2016-10-02 ENCOUNTER — Telehealth: Payer: Self-pay | Admitting: Internal Medicine

## 2016-10-02 ENCOUNTER — Other Ambulatory Visit: Payer: Self-pay | Admitting: Internal Medicine

## 2016-10-02 LAB — BMP8+ANION GAP
Anion Gap: 16 mmol/L (ref 10.0–18.0)
BUN / CREAT RATIO: 14 (ref 9–23)
BUN: 11 mg/dL (ref 6–20)
CALCIUM: 9.1 mg/dL (ref 8.7–10.2)
CHLORIDE: 100 mmol/L (ref 96–106)
CO2: 22 mmol/L (ref 18–29)
Creatinine, Ser: 0.78 mg/dL (ref 0.57–1.00)
GFR calc Af Amer: 117 mL/min/{1.73_m2} (ref >59)
GFR calc non Af Amer: 102 mL/min/{1.73_m2} (ref >59)
GLUCOSE: 88 mg/dL (ref 65–99)
POTASSIUM: 4.4 mmol/L (ref 3.5–5.2)
Sodium: 138 mmol/L (ref 134–144)

## 2016-10-02 MED ORDER — POLYMYXIN B-TRIMETHOPRIM 10000-0.1 UNIT/ML-% OP SOLN: 2.0000 [drp] | Freq: Four times a day (QID) | OPHTHALMIC | 0 refills | Status: DC

## 2016-10-02 NOTE — Telephone Encounter (Signed)
Call from patient and discussed with Glenda.   Patient prefers drops to ointment.  Will change to trimethoprim polymixin drops.  1-2 drops left eye 4X daily for 5-7 days.  Cannot confirm if she is lactating or pregnant so will avoid fluoroquinolone drops (phone call went to voicemail.)

## 2016-10-02 NOTE — Telephone Encounter (Signed)
Talked to Dr Daryll Drown - send in new rx. Called pt - no answer; left message.

## 2016-10-02 NOTE — Telephone Encounter (Signed)
   Reason for call:   I received a call from [name] at 5:37 PM indicating she needs her eye drops for "pink eye."   Pertinent Data:   Patient was prescribed Erythromycin opthalmic ointment at [facility] on 10/01/2016 for bacterial conjunctivitis.  Patient states she called the clinic back stating she preferred drops over ointment. States a physician was supposed to call in a prescription for eyedrops to her pharmacy but her pharmacy has not received that prescription.    Assessment / Plan / Recommendations:   I called the patient's pharmacy ([facility] at [address]) and they confirmed that they received a prescription for Polytrim eyedrops earlier today. Pharmacy staff told me there was confusion because they have 2 separate charts for her under different last names "Rhonda Bird" and "Rhonda Bird." I called the patient the patient back and made her aware of this confusion. Advised patient to go pick up the eyedrops from the pharmacy.    As always, pt is advised that if symptoms worsen or new symptoms arise, they should call the clinic or go to an urgent care facility/ ER for further evaluation.   Rhonda Leff, MD   10/02/2016, 5:43 PM

## 2016-10-02 NOTE — Telephone Encounter (Signed)
Called pt - no answer;left message I had called,will send her request to her doctor and if she needs further assistance to call back.

## 2016-10-02 NOTE — Telephone Encounter (Signed)
Patient states does not need an ointment for her Eye but, she needs Eye drops instead.  Patient said she was to call back with the name of the Eyedrops her son has for his pink eye which is Ofloxacin.  Please call back

## 2016-10-02 NOTE — Telephone Encounter (Addendum)
Call from pt - first clarification of Losartan; informed 50 mg daily. Then states she had requested eye drops not ointment for pink-eye. Need new rx for Ofloxacin [facility] to [facility] today -states it's not her fault the wrong med was ordered and maybe have to wait over the w/e for the requested med or have to see another doctor. I will page Dr Lovena Le and send to The Attending.

## 2016-10-14 ENCOUNTER — Encounter: Payer: Self-pay | Admitting: Gynecology

## 2016-10-29 ENCOUNTER — Ambulatory Visit (INDEPENDENT_AMBULATORY_CARE_PROVIDER_SITE_OTHER): Payer: 59 | Admitting: Internal Medicine

## 2016-10-29 ENCOUNTER — Encounter: Payer: Self-pay | Admitting: Internal Medicine

## 2016-10-29 VITALS — BP 145/84 | HR 66 | Temp 97.9°F | Wt 191.4 lb

## 2016-10-29 DIAGNOSIS — I10 Essential (primary) hypertension: Secondary | ICD-10-CM

## 2016-10-29 DIAGNOSIS — Z6830 Body mass index (BMI) 30.0-30.9, adult: Secondary | ICD-10-CM | POA: Diagnosis not present

## 2016-10-29 DIAGNOSIS — R635 Abnormal weight gain: Secondary | ICD-10-CM | POA: Diagnosis not present

## 2016-10-29 MED ORDER — LOSARTAN POTASSIUM 50 MG PO TABS: 50.0000 mg | ORAL_TABLET | Freq: Every day | ORAL | 1 refills | Status: DC

## 2016-10-29 NOTE — Progress Notes (Signed)
   CC: hypertension  HPI:  [name] is a 31 y.o. who presents today for hypertension. Please see assessment & plan for status of chronic medical problems.   Past Medical History:  Diagnosis Date  . High blood pressure 01/11/2015  . Narcolepsy    per prenatal  . Pregnancy induced hypertension    first preg  . Vaginal Pap smear, abnormal    cryo, ok since    Review of Systems:  Please see each problem below for a pertinent review of systems.  Physical Exam:  Vitals:   10/29/16 0850  BP: 136/81  Pulse: 64  Temp: 97.9 F (36.6 C)  TempSrc: Oral  SpO2: 100%  Weight: 191 lb 6.4 oz (86.8 kg)   Physical Exam  Constitutional: She is oriented to person, place, and time. No distress.  HENT:  Head: Normocephalic and atraumatic.  Eyes: Conjunctivae are normal. No scleral icterus.  Cardiovascular: Normal rate and regular rhythm.   Pulmonary/Chest: Effort normal. No respiratory distress.  Neurological: She is alert and oriented to person, place, and time.  Skin: Skin is warm and dry. She is not diaphoretic.   Assessment & Plan:   See Encounters Tab for problem based charting.  Patient discussed with Dr. Lynnae January

## 2016-10-29 NOTE — Assessment & Plan Note (Addendum)
Assessment She is concerned about her weight gain [191 lbs today, up from 178 lbs back in March 2017] over the past year and is wondering if it's related to her new BP medication. She was also interested in getting her thyroid function reassessed in light of her hair loss and low energy. She denies any dry skin or family history of autoimmune disease. She works in Therapist, art at [employer] and acknowledges not being as active as she should though she hasn't noticed any changes to her appetite.  Plan -Recheck TSH. If abnormal, I will call but will otherwise mail the result to her. -Offered referral to nutritionist and health coaching, Ms. Butch Penny, though she would like to hold off as she feels it's less likely related to caloric consumption   ADDENDUM 10/30/2016  10:21 AM:  TSH reassuring at 1.6. I will mail result to her as discussed yesterday.

## 2016-10-29 NOTE — Assessment & Plan Note (Signed)
Assessment Her BP today is 136/81 and 145/84 on recheck. On 5/3, her lisinopril 10 mg was switched to losartan 50 mg as she felt it might be related to dizziness. At the pharmacy, she picked up two prescriptions: losartan 25 mg and losartan 50 mg. She has been taking the lower dose without issue and would prefer to stay on that dose if possible. She denies any dizziness, new headaches, difficulty breathing, lip or throat swelling.   Given her age and co-morbidities, tighter BP control is advised.   Plan -Recommended she try losartan 50 mg daily but should she experience symptoms, she resume 25 mg daily. We will refill whatever she prefers.  -Follow-up in 1 month to reassess with PCP

## 2016-10-29 NOTE — Patient Instructions (Addendum)
For your blood pressure, try taking losartan 50 mg. If you start feeling dizzy again, go back to the 25 mg. Either way, let us know, so we can prescribe medication.  For the weight, we have a nutritionist, Ms. Butch Penny, who can meet with you. Let us know if you are interested.   If not, follow-up in 1 month to recheck blood pressure.

## 2016-10-30 LAB — TSH: TSH: 1.6 u[IU]/mL (ref 0.450–4.500)

## 2016-10-30 NOTE — Progress Notes (Signed)
Internal Medicine Clinic Attending  Case discussed with Dr. Patel,Rushil at the time of the visit.  We reviewed the resident's history and exam and pertinent patient test results.  I agree with the assessment, diagnosis, and plan of care documented in the resident's note.  

## 2016-11-04 ENCOUNTER — Encounter: Payer: Self-pay | Admitting: Internal Medicine

## 2016-11-24 ENCOUNTER — Ambulatory Visit (INDEPENDENT_AMBULATORY_CARE_PROVIDER_SITE_OTHER): Payer: 59 | Admitting: Internal Medicine

## 2016-11-24 ENCOUNTER — Encounter: Payer: Self-pay | Admitting: Internal Medicine

## 2016-11-24 VITALS — BP 132/82 | HR 75 | Temp 98.3°F | Ht 66.9 in | Wt 189.0 lb

## 2016-11-24 DIAGNOSIS — I10 Essential (primary) hypertension: Secondary | ICD-10-CM

## 2016-11-24 DIAGNOSIS — Z79899 Other long term (current) drug therapy: Secondary | ICD-10-CM | POA: Diagnosis not present

## 2016-11-24 DIAGNOSIS — L659 Nonscarring hair loss, unspecified: Secondary | ICD-10-CM

## 2016-11-24 DIAGNOSIS — J019 Acute sinusitis, unspecified: Principal | ICD-10-CM

## 2016-11-24 DIAGNOSIS — J018 Other acute sinusitis: Secondary | ICD-10-CM

## 2016-11-24 DIAGNOSIS — B9789 Other viral agents as the cause of diseases classified elsewhere: Secondary | ICD-10-CM | POA: Diagnosis not present

## 2016-11-24 MED ORDER — FLUTICASONE PROPIONATE 50 MCG/ACT NA SUSP: 2.0000 | Freq: Every day | NASAL | 0 refills | Status: DC

## 2016-11-24 MED ORDER — KETOCONAZOLE 2 % EX SHAM: 1.0000 "application " | MEDICATED_SHAMPOO | CUTANEOUS | 0 refills | Status: DC

## 2016-11-24 NOTE — Assessment & Plan Note (Addendum)
Assessment Blood pressure currently well controlled with losartan 50 mg daily. It is 132/82 at this visit. Patient stated she was taking an oral contraceptive pill for the past 1 year which she stopped taking 3 weeks ago as her gynecologist told her it may be making her hypertension worse. Patient's hypertension is likely due to weight gain over time and OCP use. Review of chart reveals she has gained 27 pounds over the past 7 years.  Plan -Continue current management -Educated patient about healthy eating and exercise. Emphasized the importance of weight loss.

## 2016-11-24 NOTE — Assessment & Plan Note (Signed)
History of present illness Symptoms started 4 days ago. Patient reports having sinus pain/pressure, sore throat, and chest pain only when she coughs. Denies having any shortness of breath. Denies having any fevers, chills, yellow/green nasal secretions, sneezing, or ear pain/pressure. Denies any recent sick contacts.  Assessment Her symptoms are likely secondary to viral rhinosinusitis. Sinuses tender on palpation but patient is not having any yellow/green nasal secretions. Cough likely secondary to postnasal drip. Lungs clear on auscultation and patient is afebrile.  Plan -Symptomatic management: Gargle with warm salt water/ over-the-counter Lozenges for sore throat Flonase nasal spray Tylenol as needed for sinus pain/ discomfort -Advised her to return to the clinic if her symptoms do not improve in the next 7 days

## 2016-11-24 NOTE — Progress Notes (Signed)
   CC: Patient is complaining of sore throat, cough, and sinus pain/pressure. She is also concerned about thinning of hair. Hypertension was also discussed during this visit.  HPI:  Ms.Michaelah M Kleen is a 31 y.o. female with a past medical history of conditions listed below presenting to the clinic complaining of sore throat, cough, and sinus pain/pressure. She is also concerned about thinning of hair. Hypertension was also discussed during this visit. Please see problem based charting for the status of the patient's current and chronic medical conditions.   Past Medical History:  Diagnosis Date  . High blood pressure 01/11/2015  . Narcolepsy    per prenatal  . Pregnancy induced hypertension    first preg  . Vaginal Pap smear, abnormal    cryo, ok since    Review of Systems: Pertinent positives mentioned in HPI. Remainder of all ROS negative.   Physical Exam:  Vitals:   11/24/16 1543  BP: 132/82  Pulse: 75  Temp: 98.3 F (36.8 C)  TempSrc: Oral  SpO2: 100%  Weight: 189 lb (85.7 kg)  Height: 5' 6.9" (1.699 m)   Physical Exam  Constitutional: She is oriented to person, place, and time. She appears well-developed and well-nourished. No distress.  HENT:  Head: Normocephalic and atraumatic.  Mild oropharyngeal erythema Frontal, ethmoidal, and maxillary sinuses tender to palpation. Nasal turbinates appear erythematous.  Eyes: Right eye exhibits no discharge. Left eye exhibits no discharge.  Cardiovascular: Normal rate, regular rhythm and intact distal pulses.   Pulmonary/Chest: Effort normal and breath sounds normal. No respiratory distress. She has no wheezes. She has no rales.  Abdominal: Soft. Bowel sounds are normal. She exhibits no distension. There is no tenderness.  Musculoskeletal: She exhibits no edema.  Lymphadenopathy:    She has no cervical adenopathy.  Neurological: She is alert and oriented to person, place, and time.  Skin: Skin is warm and dry.    Dandruff noted on scalp. No patchy hair loss noted.    Assessment & Plan:   See Encounters Tab for problem based charting.  Patient discussed with Dr. Lynnae January

## 2016-11-24 NOTE — Patient Instructions (Signed)
Ms. Paugh it was nice seeing you today.  -Continue taking Losartan for your blood pressure   -Use Ketoconazole shampoo for dandruff  -Please make an appointment with dermatology to discuss your hair loss   -Use Flonase nasal spray as instructed   -Return for a follow-up visit in 6 months. Return sooner if your symptoms do not improve within the next 7 days or you develop fevers, chills, or SOB.

## 2016-11-24 NOTE — Assessment & Plan Note (Addendum)
Assessment Patient continues to complain of hair thinning at this visit. States her hair is falling out for the past 3 years. She believes the hair grows fast but then she also loses it quickly. She believes the texture has changed and it is now red. Patient has not had any recent weight loss. No history of recent surgeries. Denies having any stress in her life. States her diet has not changed. Denies having any family history of hair disorder/ baldness. She is not deficient in protein as albumin checked in March 2018 was normal. Patient is not anemic as hemoglobin checked in March 2018 was normal, however, ferritin has been low on labs checked previously. TSH checked previously in March 2017 and May 2018 has been normal. She is noted to have dandruff on exam. No areas of patchy hair loss noted.  Plan -Check ferritin and iron level -Ketoconazole shampoo twice a week -Patient stated she is already being followed by dermatology for acne. Advised her to make an appointment with them to discuss her hair loss.  Addendum 11/25/2016: Iron saturation low (11%) but iron normal (36) and ferritin normal (86). She does not need iron supplementation at this time. Tried calling the patient but could not reach her over the phone.   Addendum 11/25/2016 at 7:22 PM: Spoke to the patient over the phone and discussed lab results with her. Advised her to make an appointment with dermatology.

## 2016-11-25 ENCOUNTER — Telehealth: Payer: Self-pay | Admitting: Internal Medicine

## 2016-11-25 LAB — ANEMIA PROFILE B
Basophils Absolute: 0 10*3/uL (ref 0.0–0.2)
Basos: 0 %
EOS (ABSOLUTE): 0.2 10*3/uL (ref 0.0–0.4)
Eos: 2 %
FERRITIN: 86 ng/mL (ref 15–150)
FOLATE: 7.2 ng/mL (ref >3.0)
HEMATOCRIT: 39.2 % (ref 34.0–46.6)
Hemoglobin: 12.9 g/dL (ref 11.1–15.9)
Immature Grans (Abs): 0 10*3/uL (ref 0.0–0.1)
Immature Granulocytes: 0 %
Iron Saturation: 11 % — ABNORMAL LOW (ref 15–55)
Iron: 36 ug/dL (ref 27–159)
Lymphocytes Absolute: 1.9 10*3/uL (ref 0.7–3.1)
Lymphs: 27 %
MCH: 29.6 pg (ref 26.6–33.0)
MCHC: 32.9 g/dL (ref 31.5–35.7)
MCV: 90 fL (ref 79–97)
MONOS ABS: 0.5 10*3/uL (ref 0.1–0.9)
Monocytes: 7 %
Neutrophils Absolute: 4.6 10*3/uL (ref 1.4–7.0)
Neutrophils: 64 %
Platelets: 238 10*3/uL (ref 150–379)
RBC: 4.36 x10E6/uL (ref 3.77–5.28)
RDW: 13.8 % (ref 12.3–15.4)
Retic Ct Pct: 1.1 % (ref 0.6–2.6)
Total Iron Binding Capacity: 331 ug/dL (ref 250–450)
UIBC: 295 ug/dL (ref 131–425)
VITAMIN B 12: 452 pg/mL (ref 232–1245)
WBC: 7.2 10*3/uL (ref 3.4–10.8)

## 2016-11-25 NOTE — Telephone Encounter (Signed)
Patient is returning your call.  

## 2016-11-25 NOTE — Telephone Encounter (Signed)
Spoke to the patient over the phone and discussed lab results with her.

## 2016-11-25 NOTE — Progress Notes (Signed)
Internal Medicine Clinic Attending  Case discussed with Dr. Rathoreat the time of the visit. We reviewed the resident's history and exam and pertinent patient test results. I agree with the assessment, diagnosis, and plan of care documented in the resident's note.  

## 2016-12-18 ENCOUNTER — Encounter: Payer: Self-pay | Admitting: Gynecology

## 2016-12-18 ENCOUNTER — Ambulatory Visit (INDEPENDENT_AMBULATORY_CARE_PROVIDER_SITE_OTHER): Payer: 59 | Admitting: Gynecology

## 2016-12-18 VITALS — BP 138/80

## 2016-12-18 DIAGNOSIS — L292 Pruritus vulvae: Secondary | ICD-10-CM

## 2016-12-18 DIAGNOSIS — N898 Other specified noninflammatory disorders of vagina: Secondary | ICD-10-CM

## 2016-12-18 DIAGNOSIS — Z113 Encounter for screening for infections with a predominantly sexual mode of transmission: Secondary | ICD-10-CM | POA: Diagnosis not present

## 2016-12-18 LAB — WET PREP FOR TRICH, YEAST, CLUE
CLUE CELLS WET PREP: NONE SEEN
Trich, Wet Prep: NONE SEEN
WBC WET PREP: NONE SEEN
YEAST WET PREP: NONE SEEN

## 2016-12-18 MED ORDER — FLUCONAZOLE 150 MG PO TABS: ORAL_TABLET | ORAL | 0 refills | Status: DC

## 2016-12-18 MED ORDER — CLINDAMYCIN PHOSPHATE 2 % VA CREA: 1.0000 | TOPICAL_CREAM | Freq: Every day | VAGINAL | 0 refills | Status: DC

## 2016-12-18 NOTE — Progress Notes (Signed)
   Patient is a 31 year old that presented to the office today complaining of vulvar pruritus and a vaginal odor but no discharge. Patient wanted to have a GC and Chlamydia culture but did not 1 any blood STD screening. She has been using condoms for contraception. Her last menstrual period was 1 week ago. She denies any fever, chills, nausea, vomiting. No back pain.   Exam: Pelvic: Bartholin urethra Skene was within normal limits Vagina: No lesions or discharge noted Cervix: No lesions or discharge Bimanual exam: Not done  Wet prep few bacteria otherwise normal  Assessment/plan because of patient's odor and discomfort I'm going to give her Cleocin vaginal cream to apply daily at bedtime for one week. For her external vulvar pruritus at given a prescription of Diflucan 150 mg tablet. GC Chlamydia culture obtained results pending at time of this dictation.

## 2016-12-19 LAB — GC/CHLAMYDIA PROBE AMP
CT PROBE, AMP APTIMA: NOT DETECTED
GC Probe RNA: NOT DETECTED

## 2017-01-05 ENCOUNTER — Telehealth: Payer: Self-pay | Admitting: *Deleted

## 2017-01-05 MED ORDER — FLUCONAZOLE 150 MG PO TABS: ORAL_TABLET | ORAL | 0 refills | Status: AC

## 2017-01-05 MED ORDER — CLINDAMYCIN PHOSPHATE 2 % VA CREA: 1.0000 | TOPICAL_CREAM | Freq: Every day | VAGINAL | 0 refills | Status: AC

## 2017-01-05 NOTE — Telephone Encounter (Signed)
Pt was seen on OV 12/18/16 never picked up Rx from this visit for "Cleocin vaginal cream to apply daily at bedtime for one week. For her external vulvar pruritus at given a prescription of Diflucan 150 mg tablet. " pharmacy canceled Rx as pt never went to pick up, the pharmacy allows 5 days for Rx pick. Both Rx will be re-sent.

## 2017-02-26 ENCOUNTER — Ambulatory Visit (INDEPENDENT_AMBULATORY_CARE_PROVIDER_SITE_OTHER): Payer: 59 | Admitting: Internal Medicine

## 2017-02-26 VITALS — BP 135/85 | HR 88 | Temp 98.0°F | Ht 66.9 in | Wt 195.7 lb

## 2017-02-26 DIAGNOSIS — G43009 Migraine without aura, not intractable, without status migrainosus: Secondary | ICD-10-CM

## 2017-02-26 DIAGNOSIS — I10 Essential (primary) hypertension: Secondary | ICD-10-CM | POA: Diagnosis not present

## 2017-02-26 DIAGNOSIS — Z820 Family history of epilepsy and other diseases of the nervous system: Secondary | ICD-10-CM | POA: Diagnosis not present

## 2017-02-26 DIAGNOSIS — Z79899 Other long term (current) drug therapy: Secondary | ICD-10-CM

## 2017-02-26 MED ORDER — SUMATRIPTAN SUCCINATE 25 MG PO TABS: ORAL_TABLET | ORAL | 0 refills | Status: DC

## 2017-02-26 MED ORDER — TOPAMAX 25 MG PO TABS: 25.0000 mg | ORAL_TABLET | Freq: Every day | ORAL | 3 refills | Status: DC

## 2017-02-26 NOTE — Progress Notes (Signed)
CC: headache right sided, follow up of hypertension  HPI:  [name] is a 31 y.o. who comes to clinic today for continuing headaches.  She was seen for this back in April and at that time was discovered to have hypertension.  Since her treatment for hypertension her headaches have improved she is having headaches only around 2 times per week lasting for 4-6 hours as compared to about every other day.  At the time of her increased headaches she was also on an oral contraceptive pill.  She has been off the OCP for 3 months now and her blood pressure is well controlled but she still continues to have headaches.  Her headaches are unilateral with pain behind one eye usually, she does not have a visual aura, she does have nausea,  Her sister also has migraines for which she gets treated.  She is unsure if she has photophobia today but she does mention that whenever she gets a headache she goes and lays down and closes her eyes goes to sleep.  She denies any pain in the occipital region of her head, she denies any dizziness, she has had no syncope or weakness or visual changes.  She gets her vision checked regularly and wears prescription contacts that are up-to-date.  She reports good blood pressure readings at home with consistent systolic readings in the [address] and no hypertension associated with her headaches.    Please see A&P for status of the patient's chronic medical conditions  Past Medical History:  Diagnosis Date  . High blood pressure 01/11/2015  . Narcolepsy    per prenatal  . Pregnancy induced hypertension    first preg  . Vaginal Pap smear, abnormal    cryo, ok since   Review of Systems:  ROS: Pulmonary: pt denies increased work of breathing, shortness of breath,  Cardiac: pt denies palpitations, chest pain,  Abdominal: pt denies abdominal pain, nausea, vomiting, or diarrhea  Physical Exam:  Vitals:   02/26/17 1354  BP: 135/85  Pulse: 88  Temp: 98 F (36.7 C)   TempSrc: Oral  SpO2: 100%  Weight: 195 lb 11.2 oz (88.8 kg)  Height: 5' 6.9" (1.699 m)   Physical Exam  Constitutional: She is oriented to person, place, and time. No distress.  Cardiovascular: Normal rate, regular rhythm and normal heart sounds.  Exam reveals no gallop and no friction rub.   No murmur heard. Pulmonary/Chest: Effort normal and breath sounds normal. No respiratory distress. She has no wheezes. She has no rales. She exhibits no tenderness.  Abdominal: Soft. Bowel sounds are normal. She exhibits no distension and no mass. There is no tenderness. There is no rebound and no guarding.  Neurological: She is alert and oriented to person, place, and time. She displays normal reflexes. No cranial nerve deficit or sensory deficit. She exhibits normal muscle tone. Coordination normal.  Skin: She is not diaphoretic.    Social History   Social History  . Marital status: Single    Spouse name: N/A  . Number of children: N/A  . Years of education: N/A   Occupational History  . Not on file.   Social History Main Topics  . Smoking status: Never Smoker  . Smokeless tobacco: Never Used  . Alcohol use No     Comment: socially  . Drug use: No  . Sexual activity: Yes    Birth control/ protection: None, OCP   Other Topics Concern  . Not on file  Social History Narrative  . No narrative on file    Family History  Problem Relation Age of Onset  . Adopted: Yes  . Other Neg Hx     Assessment & Plan:   See Encounters Tab for problem based charting.  Patient seen with Dr. Rebeca Alert

## 2017-02-26 NOTE — Patient Instructions (Signed)
I have prescribed two new medications for your migraines, a preventatitve topamax that you will take 25mg  the first week and you can increase by 25mg  per week until you get to 100mg  per day.  Do not take more than 100mg  per day.  I have also added sumatriptan 25mg  for when you already have a migraine and want to get rid of it.  You can take one sumatriptan 25mg  and then one more 2 hours later if you still have a headache.

## 2017-02-26 NOTE — Progress Notes (Signed)
Internal Medicine Clinic Attending  I saw and evaluated the patient.  I personally confirmed the key portions of the history and exam documented by Dr. Shan Levans and I reviewed pertinent patient test results.  The assessment, diagnosis, and plan were formulated together and I agree with the documentation in the resident's note.  Although her headaches are not as frequent, still very bothersome and occurring a couple times a week. Given the combination of periorbital pain, nausea, duration 4-6 hours, and improvement with sleep, suggestive of migraine without aura. She hasn't noticed photophobia or phonophobia, but she is in a quiet environment and lays down to rest when it comes on. Will start prophylaxis given the frequency as well as prescribe abortive therapy for her to take prn.   Oda Kilts, MD

## 2017-02-26 NOTE — Assessment & Plan Note (Signed)
Patient has classic symptoms of migraine.  Unilateral pain behind the eye, nausea, lasting from 4-6 hours.  She has a first-degree relative her sister who suffers from the same type of migraines.    -prescribed Topamax 25 mg to be increased weekly for maximum of 100 mg per week for preventative therapy, and also prescribed sumatriptan 25 mg for abortive therapy -Patient will follow up in 1 month and continue to keep her headache diary

## 2017-02-26 NOTE — Assessment & Plan Note (Signed)
BP Readings from Last 3 Encounters:  02/26/17 135/85  12/18/16 138/80  11/24/16 132/82  Patients blood pressure still under good control.  Pt reports no episodes of hypertension at home and checks her blood pressure regularly especially when she has a headache.    -Will continue losartan 50mg ,

## 2017-03-04 ENCOUNTER — Telehealth: Payer: Self-pay

## 2017-03-04 NOTE — Telephone Encounter (Signed)
Pt is calling back to speak with a nurse. Please call back.  

## 2017-03-04 NOTE — Telephone Encounter (Signed)
Requesting to speak with a nurse about meds. Please call pt back.  

## 2017-03-04 NOTE — Telephone Encounter (Signed)
Lm for rtc 

## 2017-03-05 NOTE — Telephone Encounter (Signed)
Mailbox is full.

## 2017-03-08 NOTE — Telephone Encounter (Signed)
Mailbox is full.

## 2017-03-09 NOTE — Telephone Encounter (Signed)
Late entry: Patient called 03/08/17 stating Topamax was too expensive for her wanting if there's an alternative that's cheaper. Or if there's a discount she could use. Informed abt Goodrx & said will try to use the coupon. And if that doesn't work she will call back.

## 2017-03-12 ENCOUNTER — Other Ambulatory Visit: Payer: Self-pay | Admitting: Internal Medicine

## 2017-03-12 DIAGNOSIS — G43009 Migraine without aura, not intractable, without status migrainosus: Secondary | ICD-10-CM

## 2017-03-12 MED ORDER — TOPIRAMATE 25 MG PO CPSP: 25.0000 mg | ORAL_CAPSULE | Freq: Every day | ORAL | 2 refills | Status: DC

## 2017-03-12 NOTE — Telephone Encounter (Signed)
Pt called back and made aware that rx will be changed to the generic.Regenia Skeeter, Darlene Cassady10/12/20183:04 PM

## 2017-03-12 NOTE — Telephone Encounter (Signed)
Patient said she spoke to someone last week and she is still wanting to get her medicine. The medicine that she has cost 500.00 she is wanted to generic brand

## 2017-03-12 NOTE — Addendum Note (Signed)
Addended by: Guadlupe Spanish B on: 03/12/2017 03:23 PM   Modules accepted: Orders

## 2017-03-12 NOTE — Telephone Encounter (Signed)
Call made to pharmacy to run rx under the generic "topiramate" cost of medication will be $24.  Will have ordering MD change rx to generic.Despina Hidden Cassady10/12/20182:57 PM  Attempted to contact pt-no answer and mailbox is full.Despina Hidden Cassady10/12/20182:58 PM

## 2017-03-22 ENCOUNTER — Other Ambulatory Visit: Payer: Self-pay

## 2017-03-22 DIAGNOSIS — I10 Essential (primary) hypertension: Secondary | ICD-10-CM

## 2017-03-22 NOTE — Telephone Encounter (Signed)
losartan (COZAAR) 50 MG tablet   Refill request @ [facility] on [address].

## 2017-03-23 MED ORDER — LOSARTAN POTASSIUM 50 MG PO TABS: 50.0000 mg | ORAL_TABLET | Freq: Every day | ORAL | 3 refills | Status: AC

## 2017-03-23 NOTE — Telephone Encounter (Signed)
Pt is calling back again, asking for Losartan to be filled by today.

## 2017-03-26 ENCOUNTER — Ambulatory Visit: Payer: 59

## 2017-04-05 ENCOUNTER — Ambulatory Visit (INDEPENDENT_AMBULATORY_CARE_PROVIDER_SITE_OTHER): Payer: 59 | Admitting: Internal Medicine

## 2017-04-05 VITALS — BP 136/82 | HR 75 | Temp 98.4°F | Ht 66.0 in | Wt 196.1 lb

## 2017-04-05 DIAGNOSIS — J069 Acute upper respiratory infection, unspecified: Secondary | ICD-10-CM | POA: Diagnosis not present

## 2017-04-05 DIAGNOSIS — J302 Other seasonal allergic rhinitis: Secondary | ICD-10-CM

## 2017-04-05 DIAGNOSIS — J209 Acute bronchitis, unspecified: Secondary | ICD-10-CM | POA: Diagnosis not present

## 2017-04-05 DIAGNOSIS — B9789 Other viral agents as the cause of diseases classified elsewhere: Principal | ICD-10-CM

## 2017-04-05 MED ORDER — GUAIFENESIN-CODEINE 100-10 MG/5ML PO SYRP: 5.0000 mL | ORAL_SOLUTION | Freq: Three times a day (TID) | ORAL | 0 refills | Status: DC | PRN

## 2017-04-05 MED ORDER — FLUTICASONE PROPIONATE 50 MCG/ACT NA SUSP: 1.0000 | Freq: Every day | NASAL | 0 refills | Status: DC

## 2017-04-05 NOTE — Progress Notes (Signed)
   CC: Cough  HPI:  [name] is a 31 y.o. female with PMHx detailed below presenting with 3 weeks of nonproductive coughing that has not improved much with over the counter mucinex.  See problem based assessment and plan below for additional details.  Viral URI with cough HPI: She has suffered from coughing with congestion and sore throat for about 3 weeks. She reports that her 11 year old child has had upper respiratory symptoms for about 4-5 days preceding hers and continued during this time. She has now noticed hoarseness to her voice for the past one week. She has tried some over the counter mucinex with minimal improvement. She never had fevers, fatigue, or body aches during these symptoms. She does not have seasonal allergies. A: Viral URI with associated acute bronchitis Her symptoms are likely to persist a few more weeks due to airway inflammation. Her faint expiratory wheezes are not associated with dyspnea and she has no underlying lung disease or immune deficiency that would make her high risk for complications P: Recommended starting daily sinus irrigation and flonase for congestion symptoms Prescribed cheratussin 77mL TID PRN #142mL RTC if new high fevers, dyspnea, no improvement after 3-4 weeks or longer     Past Medical History:  Diagnosis Date  . High blood pressure 01/11/2015  . Narcolepsy    per prenatal  . Pregnancy induced hypertension    first preg  . Vaginal Pap smear, abnormal    cryo, ok since    Review of Systems: Review of Systems  Constitutional: Negative for chills, fever and malaise/fatigue.  HENT: Positive for congestion and sore throat. Negative for ear pain, hearing loss and sinus pain.        Hoarseness  Eyes: Negative for blurred vision.  Respiratory: Negative for shortness of breath.   Cardiovascular: Negative for chest pain.  Gastrointestinal: Negative for diarrhea.  Genitourinary: Negative for dysuria.  Musculoskeletal:  Negative for myalgias.  Skin: Negative for rash.  Neurological: Negative for dizziness, sensory change and headaches.  Endo/Heme/Allergies: Negative for environmental allergies.  Psychiatric/Behavioral: The patient does not have insomnia.      Physical Exam: Vitals:   04/05/17 1356  BP: 136/82  Pulse: 75  Temp: 98.4 F (36.9 C)  TempSrc: Core  SpO2: 100%  Weight: 196 lb 1.6 oz (89 kg)  Height: 5\' 6"  (1.676 m)   GENERAL- alert, co-operative, NAD HEENT- No tenderness to palpation over sinuses, mild posterior oropharyngeal erythema, no cervical lymphadenopathy CARDIAC- RRR, no murmurs, rubs or gallops. RESP- CTAB, faint end expiratory wheezes with good air movement ABDOMEN- Soft, nontender, no guarding or rebound EXTREMITIES- pulse 2+, symmetric, no pedal edema. SKIN- Warm, dry, No rashes PSYCH- Normal mood and affect, appropriate thought content and speech.   Assessment & Plan:   See encounters tab for problem based medical decision making.   Patient discussed with Dr. Daryll Drown

## 2017-04-05 NOTE — Patient Instructions (Signed)
It was a pleasure to see you today.  I recommend starting once daily flonase nasal spray to help reduce congestion and cough symptoms. I also prescribed a stronger cough medicine you may take up 21mL 3 times daily as needed. I expect your cough will slowly improve on its own. Things to watch out for would be severe shortness of breath, high fevers, or worsening facial pain.  In the meantime medicines like tylenol or ibuprofen can help as needed for sore throat or low grade fever.

## 2017-04-06 NOTE — Assessment & Plan Note (Addendum)
HPI: She has suffered from coughing with congestion and sore throat for about 3 weeks. She reports that her 31 year old child has had upper respiratory symptoms for about 4-5 days preceding hers and continued during this time. She has now noticed hoarseness to her voice for the past one week. She has tried some over the counter mucinex with minimal improvement. She never had fevers, fatigue, or body aches during these symptoms. She does not have seasonal allergies. A: Viral URI with associated acute bronchitis Her symptoms are likely to persist a few more weeks due to airway inflammation. Her faint expiratory wheezes are not associated with dyspnea and she has no underlying lung disease or immune deficiency that would make her high risk for complications P: Recommended starting daily sinus irrigation and flonase for congestion symptoms Prescribed cheratussin 62mL TID PRN #127mL RTC if new high fevers, dyspnea, no improvement after 3-4 weeks or longer

## 2017-04-07 NOTE — Progress Notes (Signed)
Internal Medicine Clinic Attending  Case discussed with Dr. Rice at the time of the visit.  We reviewed the resident's history and exam and pertinent patient test results.  I agree with the assessment, diagnosis, and plan of care documented in the resident's note.  

## 2017-04-12 ENCOUNTER — Ambulatory Visit: Payer: 59

## 2017-04-14 ENCOUNTER — Ambulatory Visit (INDEPENDENT_AMBULATORY_CARE_PROVIDER_SITE_OTHER): Payer: 59 | Admitting: Internal Medicine

## 2017-04-14 ENCOUNTER — Encounter: Payer: Self-pay | Admitting: Internal Medicine

## 2017-04-14 VITALS — BP 116/73 | HR 69 | Temp 98.5°F | Ht 66.0 in | Wt 195.2 lb

## 2017-04-14 DIAGNOSIS — H6122 Impacted cerumen, left ear: Secondary | ICD-10-CM | POA: Insufficient documentation

## 2017-04-14 DIAGNOSIS — G43009 Migraine without aura, not intractable, without status migrainosus: Secondary | ICD-10-CM

## 2017-04-14 MED ORDER — CARBAMIDE PEROXIDE 6.5 % OT SOLN: 5.0000 [drp] | Freq: Two times a day (BID) | OTIC | 0 refills | Status: DC

## 2017-04-14 NOTE — Patient Instructions (Signed)
It was a pleasure to see you today Rhonda Bird.  I am glad your migraines are so much improved. We can continue the Topamax 25mg  daily and follow up for your next routine visit.  For your ear wax you can try OTC Debrox (carbamide peroxide drops) as needed if this is bothersome or causes hearing trouble.

## 2017-04-14 NOTE — Progress Notes (Signed)
   CC: Follow up for migraines after starting topamax for prophylaxis  HPI:  [name] is a 31 y.o. female with PMHx detailed below presenting for follow up of her headaches.  See problem based assessment and plan below for additional details.  Migraine headache without aura Since starting topamax '25mg'$  daily last month she has had only a single headache earlier this week. She thinks this is helping a great amount compared to averaging headaches every week. She has not noticed any side effects since starting topamax. Assessment Migraine headaches, frequency decreased with topiramate prophylaxis Plan Continue topamax '25mg'$  daily She could consider titration up if headaches increase in frequency again  Excessive cerumen in left ear canal She has cerumen in the left ear but no associated hearing changes. She does use Q-tips in her ears fairly often. She has had her ears cleaned years ago but prefers not to do so. Only the left has ever been problematic. Assessment Cerumen, incomplete obstruction of left ear canal This has been a problem multiple times for her so I think establishing a self care plan is preferrable to irrigation in the clinic today. There is no pain or hearing loss so not urgent. Plan Recommended OTC carbemide peroxide drops 5 drop BID or home irrigation kit    Past Medical History:  Diagnosis Date  . High blood pressure 01/11/2015  . Narcolepsy    per prenatal  . Pregnancy induced hypertension    first preg  . Vaginal Pap smear, abnormal    cryo, ok since    Review of Systems: Review of Systems  Constitutional: Negative for weight loss.  HENT: Negative for sinus pain.   Eyes: Negative for photophobia.  Gastrointestinal: Negative for nausea.  Neurological: Positive for headaches. Negative for sensory change.     Physical Exam: Vitals:   04/14/17 1500  BP: 116/73  Pulse: 69  Temp: 98.5 F (36.9 C)  TempSrc: Oral  SpO2: 100%  Weight:  195 lb 3.2 oz (88.5 kg)  Height: '5\' 6"'$  (1.676 m)   GENERAL- alert, co-operative, NAD HEENT- Atraumatic, PERRL, EOMI, oral mucosa appears moist, cerumen obstructing view of TM in the left ear CARDIAC- RRR, no murmurs, rubs or gallops. RESP- CTAB, no wheezes or crackles. SKIN- Warm, dry, No rash or lesion.    Assessment & Plan:   See encounters tab for problem based medical decision making.   Patient discussed with Dr. Rebeca Alert

## 2017-04-16 NOTE — Assessment & Plan Note (Signed)
Since starting topamax 25mg  daily last month she has had only a single headache earlier this week. She thinks this is helping a great amount compared to averaging headaches every week. She has not noticed any side effects since starting topamax. Assessment Migraine headaches, frequency decreased with topiramate prophylaxis Plan Continue topamax 25mg  daily She could consider titration up if headaches increase in frequency again

## 2017-04-16 NOTE — Progress Notes (Signed)
Internal Medicine Clinic Attending  Case discussed with Dr. Rice  at the time of the visit.  We reviewed the resident's history and exam and pertinent patient test results.  I agree with the assessment, diagnosis, and plan of care documented in the resident's note.  Alexander N Raines, MD   

## 2017-04-16 NOTE — Assessment & Plan Note (Signed)
She has cerumen in the left ear but no associated hearing changes. She does use Q-tips in her ears fairly often. She has had her ears cleaned years ago but prefers not to do so. Only the left has ever been problematic. Assessment Cerumen, incomplete obstruction of left ear canal This has been a problem multiple times for her so I think establishing a self care plan is preferrable to irrigation in the clinic today. There is no pain or hearing loss so not urgent. Plan Recommended OTC carbemide peroxide drops 5 drop BID or home irrigation kit

## 2017-04-30 ENCOUNTER — Other Ambulatory Visit: Payer: Self-pay | Admitting: Internal Medicine

## 2017-04-30 DIAGNOSIS — B9789 Other viral agents as the cause of diseases classified elsewhere: Principal | ICD-10-CM

## 2017-04-30 DIAGNOSIS — J069 Acute upper respiratory infection, unspecified: Secondary | ICD-10-CM

## 2017-06-07 ENCOUNTER — Encounter: Payer: Self-pay | Admitting: Internal Medicine

## 2017-06-07 ENCOUNTER — Other Ambulatory Visit: Payer: Self-pay

## 2017-06-07 ENCOUNTER — Ambulatory Visit (INDEPENDENT_AMBULATORY_CARE_PROVIDER_SITE_OTHER): Payer: 59 | Admitting: Internal Medicine

## 2017-06-07 VITALS — BP 124/70 | HR 71 | Temp 98.1°F | Ht 66.0 in | Wt 196.3 lb

## 2017-06-07 DIAGNOSIS — I10 Essential (primary) hypertension: Secondary | ICD-10-CM | POA: Diagnosis not present

## 2017-06-07 DIAGNOSIS — Z79899 Other long term (current) drug therapy: Secondary | ICD-10-CM | POA: Diagnosis not present

## 2017-06-07 DIAGNOSIS — R0789 Other chest pain: Secondary | ICD-10-CM

## 2017-06-07 DIAGNOSIS — G43009 Migraine without aura, not intractable, without status migrainosus: Secondary | ICD-10-CM

## 2017-06-07 MED ORDER — TOPIRAMATE 25 MG PO CPSP: 50.0000 mg | ORAL_CAPSULE | Freq: Every day | ORAL | 5 refills | Status: DC

## 2017-06-07 NOTE — Assessment & Plan Note (Signed)
Patient states that they began about 3 yrs ago but over last year have become more frequent (2-3 times per week). She was started on Topamax 25mg  daily for prophylaxis which initially decreased the frequency of migraines for about 1-2 weeks, however since then frequency has gone back up to 2-3 times per week. She endorses an episode of migraine over the weekend that lasted 2 days; it was right sided, throbbing behind her right eye and associated with nausea; she was able to sleep and it was somewhat improved by sleep; she tried taking tylenol 500mg  twice without improvement. The migraine resolved on its own. She denies recent illness; she is unable to name any triggers to her headaches and has not been keeping a migraine diary. Her extended migraine did occur during a trip out of state.  Plan: --increase topamax to 50mg  qhs --discussed at length migraine triggers, preventative measures --patient to keep migraine diary --advised for her to try ibuprofen for next migraine

## 2017-06-07 NOTE — Patient Instructions (Addendum)
FOLLOW-UP INSTRUCTIONS When: 1 month For: migraines What to bring: your medicines  Bring your blood pressure medicine to your pharmacy and they will be able to check if your particular manufacturer of Losartan has been recalled and give you a replacement medicine from a different company if needed.  For your migraines, increase you topamax to 50mg  at night Continue keeping a migraine diary to see if you can find triggers that you may be able to avoid in the future; common ones are change in sleep cycle, lack of sleep, missing meals, dehydration, certain smells, etc.  To treat your migraines, try ibuprofen 400mg  as soon as your pain begins.    Migraine Headache A migraine headache is an intense, throbbing pain on one side or both sides of the head. Migraines may also cause other symptoms, such as nausea, vomiting, and sensitivity to light and noise. What are the causes? Doing or taking certain things may also trigger migraines, such as:  Alcohol.  Smoking.  Medicines, such as: ? Birth control pills. ? Estrogen pills.  Aged cheeses, chocolate, or caffeine.  Foods or drinks that contain nitrates, glutamate, aspartame, or tyramine.  Physical activity.  Other things that may trigger a migraine include:  Menstruation.  Pregnancy.  Hunger.  Stress, lack of sleep, too much sleep, or fatigue.  Weather changes.  What increases the risk? The following factors may make you more likely to experience migraine headaches:  Age. Risk increases with age.  Family history of migraine headaches.  Being Caucasian.  Depression and anxiety.  Obesity.  Being a woman.  Having a hole in the heart (patent foramen ovale) or other heart problems.  What are the signs or symptoms? The main symptom of this condition is pulsating or throbbing pain. Pain may:  Happen in any area of the head, such as on one side or both sides.  Interfere with daily activities.  Get worse with physical  activity.  Get worse with exposure to bright lights or loud noises.  Other symptoms may include:  Nausea.  Vomiting.  Dizziness.  General sensitivity to bright lights, loud noises, or smells.  Before you get a migraine, you may get warning signs that a migraine is developing (aura). An aura may include:  Seeing flashing lights or having blind spots.  Seeing bright spots, halos, or zigzag lines.  Having tunnel vision or blurred vision.  Having numbness or a tingling feeling.  Having trouble talking.  Having muscle weakness.  How is this diagnosed? A migraine headache can be diagnosed based on:  Your symptoms.  A physical exam.  Tests, such as CT scan or MRI of the head. These imaging tests can help rule out other causes of headaches.  Taking fluid from the spine (lumbar puncture) and analyzing it (cerebrospinal fluid analysis, or CSF analysis).  How is this treated? A migraine headache is usually treated with medicines that:  Relieve pain.  Relieve nausea.  Prevent migraines from coming back.  Treatment may also include:  Acupuncture.  Lifestyle changes like avoiding foods that trigger migraines.  Follow these instructions at home: Medicines  Take over-the-counter and prescription medicines only as told by your health care provider.  Do not drive or use heavy machinery while taking prescription pain medicine.  To prevent or treat constipation while you are taking prescription pain medicine, your health care provider may recommend that you: ? Drink enough fluid to keep your urine clear or pale yellow. ? Take over-the-counter or prescription medicines. ? Eat foods  that are high in fiber, such as fresh fruits and vegetables, whole grains, and beans. ? Limit foods that are high in fat and processed sugars, such as fried and sweet foods. Lifestyle  Avoid alcohol use.  Do not use any products that contain nicotine or tobacco, such as cigarettes and  e-cigarettes. If you need help quitting, ask your health care provider.  Get at least 8 hours of sleep every night.  Limit your stress. General instructions   Keep a journal to find out what may trigger your migraine headaches. For example, write down: ? What you eat and drink. ? How much sleep you get. ? Any change to your diet or medicines.  If you have a migraine: ? Avoid things that make your symptoms worse, such as bright lights. ? It may help to lie down in a dark, quiet room. ? Do not drive or use heavy machinery. ? Ask your health care provider what activities are safe for you while you are experiencing symptoms.  Keep all follow-up visits as told by your health care provider. This is important. Contact a health care provider if:  You develop symptoms that are different or more severe than your usual migraine symptoms. Get help right away if:  Your migraine becomes severe.  You have a fever.  You have a stiff neck.  You have vision loss.  Your muscles feel weak or like you cannot control them.  You start to lose your balance often.  You develop trouble walking.  You faint.   This information is not intended to replace advice given to you by your health care provider. Make sure you discuss any questions you have with your health care provider. Document Released: 05/18/2005 Document Revised: 12/06/2015 Document Reviewed: 11/04/2015 Elsevier Interactive Patient Education  2017 [employer].

## 2017-06-07 NOTE — Assessment & Plan Note (Signed)
Patient endorses intermittent chest pain over last couple of days that is sharp in nature and provoked by movement. She denies shortness of breath. She denies increased UE activity, lifting objects or trauma to chest/shoulders.   Cardiopulmonary exam is unremarkable. MKS exam is unremarkable, however her description points to a musculoskeletal chest pain.  Plan: --continue monitoring --PRN tylenol, ibuprofen, capsaicin cream --advised to RTC if pain changes or is persistant

## 2017-06-07 NOTE — Progress Notes (Signed)
   CC: Migraines  HPI:  Rhonda Bird is a 32 y.o. with a PMH of HTN,  migraines presenting to clinic for f/u on migraines, HTN, and evaluation of chest pain.  Migraines: Patient states that they began about 3 yrs ago but over last year have become more frequent (2-3 times per week). She was started on Topamax 25mg  daily for prophylaxis which initially decreased the frequency of migraines for about 1-2 weeks, however since then frequency has gone back up to 2-3 times per week. She endorses an episode of migraine over the weekend that lasted 2 days; it was right sided, throbbing behind her right eye and associated with nausea; she was able to sleep and it was somewhat improved by sleep; she tried taking tylenol 500mg  twice without improvement. The migraine resolved on its own. She denies recent illness; she is unable to name any triggers to her headaches and has not been keeping a migraine diary. Her extended migraine did occur during a trip out of state.   HTN: Patient concerned about need to switch HTN medicine due to recent recall. She has been on losartan; recent recall for losartan has been for pills made by one specific manufacturer.   Chest pain: Patient endorses intermittent chest pain that is sharp in nature and provoked by movement. She denies shortness of breath. She denies increased UE activity, lifting objects or trauma to chest/shoulders.   Patient does not smoke, drinks occasionally.   Please see problem based Assessment and Plan for status of patients chronic conditions.  Past Medical History:  Diagnosis Date  . High blood pressure 01/11/2015  . Narcolepsy    per prenatal  . Pregnancy induced hypertension    first preg  . Vaginal Pap smear, abnormal    cryo, ok since    Review of Systems:   ROS Per HPI  Physical Exam:  Vitals:   06/07/17 1322  BP: 124/70  Pulse: 71  Temp: 98.1 F (36.7 C)  TempSrc: Oral  SpO2: 100%  Weight: 196 lb 4.8 oz (89 kg)   Height: 5\' 6"  (1.676 m)   GENERAL- alert, co-operative, appears as stated age, not in any distress. HEENT- Atraumatic, normocephalic, PERRL, EOMI, oral mucosa appears moist CARDIAC- RRR, no murmurs, rubs or gallops, no LE edema MSK-  No reproducible chest tenderness, no tenderness on palpation of bil shoulders; UE ROM and strength intact bil RESP- Moving equal volumes of air, and clear to auscultation bilaterally, no wheezes or crackles. NEURO- CN 2-12 intact, bil UE and LE strength intact. SKIN- Warm, dry, No rash or lesion. PSYCH- Normal mood and affect, appropriate thought content and speech.  Assessment & Plan:   See Encounters Tab for problem based charting.   Patient discussed with Dr. Abelardo Diesel, MD Internal Medicine PGY2

## 2017-06-07 NOTE — Assessment & Plan Note (Signed)
Patient concerned about need to switch HTN medicine due to recent recall. She has been on losartan; recent recall for losartan has been for pills made by one specific manufacturer.   BP well controlled.   Plan: --continue losartan 50mg  daily --advised patient to take med bottle to her pharmacy so they can check her manufacturer and switch if necessary

## 2017-06-08 ENCOUNTER — Telehealth: Payer: Self-pay | Admitting: *Deleted

## 2017-06-08 DIAGNOSIS — G43009 Migraine without aura, not intractable, without status migrainosus: Secondary | ICD-10-CM

## 2017-06-08 MED ORDER — TOPIRAMATE 25 MG PO TABS: 50.0000 mg | ORAL_TABLET | Freq: Every day | ORAL | 2 refills | Status: AC

## 2017-06-08 NOTE — Progress Notes (Signed)
Internal Medicine Clinic Attending  Case discussed with Dr. Svalina  at the time of the visit.  We reviewed the resident's history and exam and pertinent patient test results.  I agree with the assessment, diagnosis, and plan of care documented in the resident's note.  

## 2017-06-08 NOTE — Telephone Encounter (Signed)
Received fax clarification from [facility]:  Topiramate 25 mg capsule (ordered 06/07/17)   "Missing/illegible info on rx-further clarification : Patient has been on tablets, capsules are the sprinkle  Pls. Send new rx for tablets if appropriate.

## 2017-06-30 ENCOUNTER — Encounter: Payer: Self-pay | Admitting: Internal Medicine

## 2017-06-30 ENCOUNTER — Ambulatory Visit (INDEPENDENT_AMBULATORY_CARE_PROVIDER_SITE_OTHER): Payer: 59 | Admitting: Internal Medicine

## 2017-06-30 VITALS — BP 134/71 | HR 125 | Temp 100.7°F | Wt 197.9 lb

## 2017-06-30 DIAGNOSIS — J029 Acute pharyngitis, unspecified: Secondary | ICD-10-CM

## 2017-06-30 DIAGNOSIS — R000 Tachycardia, unspecified: Secondary | ICD-10-CM

## 2017-06-30 DIAGNOSIS — R05 Cough: Secondary | ICD-10-CM

## 2017-06-30 DIAGNOSIS — R509 Fever, unspecified: Secondary | ICD-10-CM | POA: Insufficient documentation

## 2017-06-30 DIAGNOSIS — R5383 Other fatigue: Secondary | ICD-10-CM

## 2017-06-30 DIAGNOSIS — G47419 Narcolepsy without cataplexy: Secondary | ICD-10-CM | POA: Diagnosis not present

## 2017-06-30 DIAGNOSIS — R197 Diarrhea, unspecified: Secondary | ICD-10-CM

## 2017-06-30 DIAGNOSIS — I10 Essential (primary) hypertension: Secondary | ICD-10-CM

## 2017-06-30 DIAGNOSIS — M791 Myalgia, unspecified site: Secondary | ICD-10-CM

## 2017-06-30 DIAGNOSIS — Z79899 Other long term (current) drug therapy: Secondary | ICD-10-CM | POA: Diagnosis not present

## 2017-06-30 DIAGNOSIS — G43009 Migraine without aura, not intractable, without status migrainosus: Secondary | ICD-10-CM

## 2017-06-30 LAB — RESPIRATORY PANEL BY PCR
Adenovirus: NOT DETECTED
Bordetella pertussis: NOT DETECTED
CORONAVIRUS 229E-RVPPCR: NOT DETECTED
CORONAVIRUS HKU1-RVPPCR: NOT DETECTED
CORONAVIRUS OC43-RVPPCR: NOT DETECTED
Chlamydophila pneumoniae: NOT DETECTED
Coronavirus NL63: NOT DETECTED
Influenza A H1 2009: DETECTED — AB
Influenza B: NOT DETECTED
Metapneumovirus: NOT DETECTED
Mycoplasma pneumoniae: NOT DETECTED
PARAINFLUENZA VIRUS 1-RVPPCR: NOT DETECTED
Parainfluenza Virus 2: NOT DETECTED
Parainfluenza Virus 3: NOT DETECTED
Parainfluenza Virus 4: NOT DETECTED
Respiratory Syncytial Virus: NOT DETECTED
Rhinovirus / Enterovirus: NOT DETECTED

## 2017-06-30 LAB — RAPID STREP SCREEN (MED CTR MEBANE ONLY): STREPTOCOCCUS, GROUP A SCREEN (DIRECT): NEGATIVE

## 2017-06-30 MED ORDER — BENZOCAINE-MENTHOL 6-10 MG MT LOZG: 1.0000 | LOZENGE | OROMUCOSAL | 0 refills | Status: DC | PRN

## 2017-06-30 MED ORDER — SALINE SPRAY 0.65 % NA SOLN: 1.0000 | NASAL | 0 refills | Status: AC | PRN

## 2017-06-30 MED ORDER — GUAIFENESIN 200 MG PO TABS: 200.0000 mg | ORAL_TABLET | ORAL | 0 refills | Status: DC | PRN

## 2017-06-30 NOTE — Assessment & Plan Note (Signed)
Assessment BP 134/71 today, HR 125. She has an acute illness, most likely viral. She is on losartan 50mg  daily. Hold losartan while she is not feeling well due to concern for AKI secondary to dehydration. Discussed with patient and she is agreeable to this plan.  Plan - Hold losartan while she is not feeling well - Restart losartan 50mg  daily when feeling better

## 2017-06-30 NOTE — Progress Notes (Signed)
   CC: sore throat and cough  HPI: [name] is a 32 y.o. female with PMH of HTN and narcolepsy who presents with 3 days of sore throat, fatigue, cough, and congestion.  3 days ago, patient states she started having a sore throat and diarrhea. Yesterday, she developed myalgias, fatigue, congestion, nonbloody productive cough, runny nose, headache, nausea, and sharp chest pain that is exacerbated by deep breaths and cough. She did not get a flu shot this year, she denies sick contacts or recent travel. She has not tried anything for this illness.  Past Medical History:  Diagnosis Date  . High blood pressure 01/11/2015  . Narcolepsy    per prenatal  . Pregnancy induced hypertension    first preg  . Vaginal Pap smear, abnormal    cryo, ok since   Review of Systems:   GEN: Positive for fevers and chills. Positive for myalgias and fatigue. HEENT: Positive for sore throat and congestion CV: Positive for sharp chest pain that worsens with cough and deep breaths PULM: Positive for SOB and cough ABD: Negative for abdominal pain. Positive for loose stools and nausea. NEURO: Positive for headache and photophobia.  Physical Exam:  Vitals:   06/30/17 1449  BP: 134/71  Pulse: (!) 125  Temp: (!) 100.7 F (38.2 C)  TempSrc: Oral  SpO2: 100%  Weight: 197 lb 14.4 oz (89.8 kg)   GEN: Young female sitting in chair; appears uncomfortable but in NAD. Mildly diaphoretic HEENT: PERRL. Mild erythema in posterior pharynx, no exudates visible. No sinus pain or tenderness. NECK: Non-tender cervical lymphadenopathy CV: Tachycardic, RR. No m/r/g. TTP on anterior chest wall PULM: CTAB, no wheezes or rales ABD: Soft, NT, ND, +BS MSK: No LE edema PSYCH: Intermittently tearful throughout exam  Assessment & Plan:   See Encounters Tab for problem based charting.  Patient discussed with Dr. Lynnae January

## 2017-06-30 NOTE — Assessment & Plan Note (Addendum)
Assessment 3 days ago, patient states she started having a sore throat and diarrhea. Yesterday, she developed myalgias, fatigue, congestion, nonbloody productive cough, runny nose, headache, nausea, and sharp chest pain that is exacerbated by deep breaths and cough. She did not get a flu shot this year, she denies sick contacts or recent travel. She has not tried anything for this illness. She is febrile to 100.7 and tachycardic to 125. Lungs are clear on exam. She most likely has a viral infection that would improve with symptomatic treatment.  However, when I informed her of this, she became frustrated and voiced that the last time she was here with a sore throat, she was misdiagnosed with a viral illness. She went to a different healthcare provider, who ran a "blood test for bacteria" and diagnosed her with a bacterial infection. She was given doxycycline by that provider. She states her symptoms did not improve until she took that are some antibiotics. I discussed with her that her presentation at that time was different than her current presentation. In November, she had had 3 weeks of sore throat and congestion, whereas this time she has not been feeling well for 3 days. In addition, treatment for viral illnesses is time and it is tough to say whether it was truly the antibiotic or just time that led to her improvement. Despite this, she continued to insist that she would like to know whether she has a viral infection and insisted that she needed a strep test.  She does not meet [facility] criteria, however she states she has had strep throat in the past without any of the listed symptoms and is insistent on getting a test, so we will check strep test and respiratory viral panel today. I also am not sure what lab the other provider ordered to diagnose her with a bacterial infection, but I do not think she requires antibiotics at this point. I let her know that I would call with the results of the RVP and  strep test and prescribe an antibiotic if needed. In the meantime, we will do symptomatic treatment and patient to return to clinic in 1 week with PCP followup.  Plan - Rapid strep test - Respiratory viral panel - Encouraged PO hydration - Hold losartan while not feeling well - Advised Tylenol and Advil PRN for fever, headache, and myalgias (Patient states that she does not want to take tylenol or advil) - Chloraseptic lozenge as needed for sore throat - Guaifenesin 200mg  q4h PRN for cough - Saline nasal spray as needed for congestion - RTC on 07/06/2017 with PCP  ADDENDUM: Rapid strep test negative. RVP returned positive for Influenza A. Called patient to inform her of the results. She expressed frustration that the diagnosis took so long and requested a prescription for oseltamivir. I told her that she had described sore throat 3 days prior to her clinic visit, and thus I thought she would be out of the window for oseltamivir. However, she stated that it was just the sore throat and diarrhea that started 3 days ago, but all the other symptoms started the day prior to her visit and thus asked for tamiflu to be sent in. I apologized to her for the miscommunication. Sent prescription for Oseltamivir 75mg  BID x7d and continued to encourage supportive treatment.

## 2017-06-30 NOTE — Patient Instructions (Addendum)
FOLLOW-UP INSTRUCTIONS When: 07/06/2017 with PCP For: migraines What to bring: medications     Rhonda Bird,  It was a pleasure to meet you today.  You most likely have the flu or another viral illness. We are checking a viral panel, which includes the flu, as well as a strep throat test. I will call you with the results.  The treatment for a viral treatment is supportive therapy. One of the most important things is making sure you stay hydrated. I recommend trying Gatorade or some kind of broth to make sure you are getting some electrolytes to stay hydrated. In addition, you can try tylenol (maximum dose of 3g in one day) for your fevers and advil as needed for your headache and muscle aches.  For your sore throat, you can use 1 Chloraseptic lozenge every 2 hours as needed. For your cough, you can use guaifenesin 200mg  every 4 hours as needed. For your congestion, you can use saline nasal spray as needed.  Please do not take your losartan while you are not feeling well. Once you start to feel better, please restart taking losartan 50mg  once a day.  Please return to our clinic on 07/06/2017 for follow-up with your primary doctor.     Viral Illness, Adult Viruses are tiny germs that can get into a person's body and cause illness. There are many different types of viruses, and they cause many types of illness. Viral illnesses can range from mild to severe. They can affect various parts of the body. Common illnesses that are caused by a virus include colds and the flu. Viral illnesses also include serious conditions such as HIV/AIDS (human immunodeficiency virus/acquired immunodeficiency syndrome). A few viruses have been linked to certain cancers. What are the causes? Many types of viruses can cause illness. Viruses invade cells in your body, multiply, and cause the infected cells to malfunction or die. When the cell dies, it releases more of the virus. When this happens, you  develop symptoms of the illness, and the virus continues to spread to other cells. If the virus takes over the function of the cell, it can cause the cell to divide and grow out of control, as is the case when a virus causes cancer. Different viruses get into the body in different ways. You can get a virus by:  Swallowing food or water that is contaminated with the virus.  Breathing in droplets that have been coughed or sneezed into the air by an infected person.  Touching a surface that has been contaminated with the virus and then touching your eyes, nose, or mouth.  Being bitten by an insect or animal that carries the virus.  Having sexual contact with a person who is infected with the virus.  Being exposed to blood or fluids that contain the virus, either through an open cut or during a transfusion.  If a virus enters your body, your body's defense system (immune system) will try to fight the virus. You may be at higher risk for a viral illness if your immune system is weak. What are the signs or symptoms? Symptoms vary depending on the type of virus and the location of the cells that it invades. Common symptoms of the main types of viral illnesses include: Cold and flu viruses  Fever.  Headache.  Sore throat.  Muscle aches.  Nasal congestion.  Cough. Digestive system (gastrointestinal) viruses  Fever.  Abdominal pain.  Nausea.  Diarrhea. Liver viruses (hepatitis)  Loss of appetite.  Tiredness.  Yellowing of the skin (jaundice). Brain and spinal cord viruses  Fever.  Headache.  Stiff neck.  Nausea and vomiting.  Confusion or sleepiness. Skin viruses  Warts.  Itching.  Rash. Sexually transmitted viruses  Discharge.  Swelling.  Redness.  Rash. How is this treated? Viruses can be difficult to treat because they live within cells. Antibiotic medicines do not treat viruses because these drugs do not get inside cells. Treatment for a viral  illness may include:  Resting and drinking plenty of fluids.  Medicines to relieve symptoms. These can include over-the-counter medicine for pain and fever, medicines for cough or congestion, and medicines to relieve diarrhea.  Antiviral medicines. These drugs are available only for certain types of viruses. They may help reduce flu symptoms if taken early. There are also many antiviral medicines for hepatitis and HIV/AIDS.  Some viral illnesses can be prevented with vaccinations. A common example is the flu shot. Follow these instructions at home: Medicines   Take over-the-counter and prescription medicines only as told by your health care provider.  If you were prescribed an antiviral medicine, take it as told by your health care provider. Do not stop taking the medicine even if you start to feel better.  Be aware of when antibiotics are needed and when they are not needed. Antibiotics do not treat viruses. If your health care provider thinks that you may have a bacterial infection as well as a viral infection, you may get an antibiotic. ? Do not ask for an antibiotic prescription if you have been diagnosed with a viral illness. That will not make your illness go away faster. ? Frequently taking antibiotics when they are not needed can lead to antibiotic resistance. When this develops, the medicine no longer works against the bacteria that it normally fights. General instructions  Drink enough fluids to keep your urine clear or pale yellow.  Rest as much as possible.  Return to your normal activities as told by your health care provider. Ask your health care provider what activities are safe for you.  Keep all follow-up visits as told by your health care provider. This is important. How is this prevented? Take these actions to reduce your risk of viral infection:  Eat a healthy diet and get enough rest.  Wash your hands often with soap and water. This is especially important when  you are in public places. If soap and water are not available, use hand sanitizer.  Avoid close contact with friends and family who have a viral illness.  If you travel to areas where viral gastrointestinal infection is common, avoid drinking water or eating raw food.  Keep your immunizations up to date. Get a flu shot every year as told by your health care provider.  Do not share toothbrushes, nail clippers, razors, or needles with other people.  Always practice safe sex.  Contact a health care provider if:  You have symptoms of a viral illness that do not go away.  Your symptoms come back after going away.  Your symptoms get worse. Get help right away if:  You have trouble breathing.  You have a severe headache or a stiff neck.  You have severe vomiting or abdominal pain. This information is not intended to replace advice given to you by your health care provider. Make sure you discuss any questions you have with your health care provider. Document Released: 09/27/2015 Document Revised: 10/30/2015 Document Reviewed: 09/27/2015 Elsevier Interactive Patient Education  [employer].

## 2017-06-30 NOTE — Assessment & Plan Note (Signed)
Assessment Patient endorses migraines associated with photophobia. She was seen on 1/7 with an increased dose of topamax to 50mg  daily. She states this has not helped. She reports she has tried tylenol, ibuprofen, and sumatriptan with minimal relief in her headaches. She does have an acute viral-like illness currently and complains of a headache. I'm not sure whether her headache is from her acute illness versus migraines. Thus, I will have her come back in one week with her primary doctor for reassessment of her migraines.  Plan - Continue topamax 50mg  daily - F/u on 07/06/2017 with PCP

## 2017-07-01 ENCOUNTER — Telehealth: Payer: Self-pay

## 2017-07-01 MED ORDER — OSELTAMIVIR PHOSPHATE 75 MG PO CAPS: 75.0000 mg | ORAL_CAPSULE | Freq: Two times a day (BID) | ORAL | 0 refills | Status: AC

## 2017-07-01 NOTE — Addendum Note (Signed)
Addended by: Oretha Caprice on: 07/01/2017 04:38 PM   Modules accepted: Orders

## 2017-07-01 NOTE — Telephone Encounter (Signed)
Called pt to inform her of diagnosis of Influenza A. Patient requested tamiflu. Sent prescription to her pharmacy

## 2017-07-01 NOTE — Telephone Encounter (Signed)
Pt called again wanting to know if she has the flu. Spoke to Dr Ronalee Red - stated she will call pt. Pt called/made aware.

## 2017-07-01 NOTE — Progress Notes (Signed)
Internal Medicine Clinic Attending  Case discussed with Dr. Huang at the time of the visit.  We reviewed the resident's history and exam and pertinent patient test results.  I agree with the assessment, diagnosis, and plan of care documented in the resident's note. 

## 2017-07-01 NOTE — Telephone Encounter (Signed)
Requesting test result. Please call back.  

## 2017-07-03 ENCOUNTER — Encounter (HOSPITAL_COMMUNITY): Payer: Self-pay | Admitting: *Deleted

## 2017-07-03 ENCOUNTER — Ambulatory Visit (HOSPITAL_COMMUNITY)
Admission: EM | Admit: 2017-07-03 | Discharge: 2017-07-03 | Disposition: A | Discharge status: Home / Self Care | Payer: 59 | Attending: Emergency Medicine | Admitting: Emergency Medicine

## 2017-07-03 DIAGNOSIS — R6889 Other general symptoms and signs: Secondary | ICD-10-CM

## 2017-07-03 DIAGNOSIS — R112 Nausea with vomiting, unspecified: Secondary | ICD-10-CM | POA: Diagnosis not present

## 2017-07-03 DIAGNOSIS — R61 Generalized hyperhidrosis: Secondary | ICD-10-CM

## 2017-07-03 DIAGNOSIS — R531 Weakness: Secondary | ICD-10-CM

## 2017-07-03 DIAGNOSIS — R509 Fever, unspecified: Secondary | ICD-10-CM

## 2017-07-03 LAB — CULTURE, GROUP A STREP (THRC)

## 2017-07-03 MED ORDER — PROMETHAZINE HCL 25 MG PO TABS: 25.0000 mg | ORAL_TABLET | Freq: Three times a day (TID) | ORAL | 0 refills | Status: AC | PRN

## 2017-07-03 MED ORDER — ONDANSETRON HCL 4 MG/2ML IJ SOLN
4.0000 mg | Freq: Once | INTRAMUSCULAR | Status: AC
  Administered 2017-07-03: 4 mg via INTRAVENOUS

## 2017-07-03 MED ORDER — ONDANSETRON 4 MG PO TBDP
ORAL_TABLET | ORAL | Status: AC
  Filled 2017-07-03: qty 1

## 2017-07-03 MED ORDER — SODIUM CHLORIDE 0.9 % IV SOLN
Freq: Once | INTRAVENOUS | Status: AC
  Administered 2017-07-03: 17:00:00 via INTRAVENOUS

## 2017-07-03 NOTE — Discharge Instructions (Signed)
Fluids rest and do no take your tamiflu medication on an empty stomach.

## 2017-07-03 NOTE — ED Provider Notes (Signed)
[facility]    CSN: [id number] Arrival date & time: 07/03/17  1519     History   Chief Complaint Chief Complaint  Patient presents with  . Influenza    HPI Rhonda Bird is a 32 y.o. female.  patient states she was seen at the internal medicine urgent care clinic a few days ago and prescribed Tamiflu and told she had the flu. Patient states that she is "feeling way better" since she was a few days ago but she is still not able to tolerate her symptoms. Patient states that she is [place] throwing up since she has been taking the Tamiflu on an empty stomach and she is "unable to keep anything down. Requesting IV fluids. Patient is additionally on her menses currently.  HPI  Past Medical History:  Diagnosis Date  . High blood pressure 01/11/2015  . Narcolepsy    per prenatal  . Pregnancy induced hypertension    first preg  . Vaginal Pap smear, abnormal    cryo, ok since    Patient Active Problem List   Diagnosis Date Noted  . Fever 06/30/2017  . Musculoskeletal chest pain 06/07/2017  . Excessive cerumen in left ear canal 04/14/2017  . Migraine headache without aura 02/26/2017  . Weight gain 10/29/2016  . Hair loss 08/21/2015  . Healthcare maintenance 08/21/2015  . Essential hypertension 01/11/2015    Past Surgical History:  Procedure Laterality Date  . DILATION AND CURETTAGE OF UTERUS    . TONSILLECTOMY      OB History    Gravida Para Term Preterm AB Living   5 2 2  0 3 2   SAB TAB Ectopic Multiple Live Births   0 3 0 0 1       Home Medications    Prior to Admission medications   Medication Sig Start Date End Date Taking? Authorizing Provider  losartan (COZAAR) 50 MG tablet Take 1 tablet (50 mg total) by mouth daily. 03/23/17 03/23/18 Yes Bartholomew Crews, MD  oseltamivir (TAMIFLU) 75 MG capsule Take 1 capsule (75 mg total) by mouth 2 (two) times daily for 7 days. 07/01/17 07/08/17 Yes Colbert Ewing, MD  topiramate (TOPAMAX) 25 MG  tablet Take 2 tablets (50 mg total) by mouth at bedtime. 06/08/17 06/08/18 Yes Alphonzo Grieve, MD  benzocaine-menthol (CHLORASEPTIC SORE THROAT) 6-10 MG lozenge Take 1 lozenge by mouth as needed for sore throat. 06/30/17   Colbert Ewing, MD  carbamide peroxide (DEBROX) 6.5 % OTIC solution Place 5 drops 2 (two) times daily into the left ear. As needed for ear wax obstruction 04/14/17   Rice, Resa Miner, MD  clindamycin (CLEOCIN) 2 % vaginal cream Place 1 Applicatorful vaginally at bedtime. 01/05/17   Princess Bruins, MD  fluconazole (DIFLUCAN) 150 MG tablet Take 1 tablet today repeat in 72 hours 01/05/17   Princess Bruins, MD  fluticasone (FLONASE) 50 MCG/ACT nasal spray PLACE 1 SPRAY DAILY INTO BOTH NOSTRILS. 04/30/17   Shela Leff, MD  guaiFENesin 200 MG tablet Take 1 tablet (200 mg total) by mouth every 4 (four) hours as needed for cough or to loosen phlegm. 06/30/17   Colbert Ewing, MD  ketoconazole (NIZORAL) 2 % shampoo Apply 1 application topically 2 (two) times a week. 11/26/16   Shela Leff, MD  promethazine (PHENERGAN) 25 MG tablet Take 1 tablet (25 mg total) by mouth every 8 (eight) hours as needed for nausea or vomiting. 07/03/17   Nehemiah Settle, NP  sodium chloride (OCEAN) 0.65 % SOLN  nasal spray Place 1 spray into both nostrils as needed for congestion. 06/30/17   Colbert Ewing, MD    Family History Family History  Adopted: Yes  Problem Relation Age of Onset  . Other Neg Hx     Social History Social History   Tobacco Use  . Smoking status: Never Smoker  . Smokeless tobacco: Never Used  Substance Use Topics  . Alcohol use: No    Comment: socially  . Drug use: No     Allergies   Penicillins   Review of Systems Review of Systems  Constitutional: Positive for activity change, appetite change, chills, diaphoresis, fatigue and fever.  HENT: Negative for congestion, ear pain and sore throat.   Eyes: Negative for pain and visual disturbance.    Respiratory: Positive for cough. Negative for shortness of breath.   Cardiovascular: Negative for chest pain and palpitations.  Gastrointestinal: Positive for nausea and vomiting. Negative for abdominal pain.  Genitourinary: Negative for dysuria and hematuria.  Musculoskeletal: Negative for arthralgias and back pain.  Skin: Negative for color change and rash.  Neurological: Positive for weakness. Negative for seizures and syncope.  All other systems reviewed and are negative.    Physical Exam Triage Vital Signs ED Triage Vitals  Enc Vitals Group     BP 07/03/17 1616 136/90     Pulse Rate 07/03/17 1616 (!) 109     Resp 07/03/17 1616 18     Temp 07/03/17 1616 98.7 F (37.1 C)     Temp Source 07/03/17 1616 Oral     SpO2 07/03/17 1616 100 %     Weight --      Height --      Head Circumference --      Peak Flow --      Pain Score 07/03/17 1614 10     Pain Loc --      Pain Edu? --      Excl. in [place]? --    No data found.  Updated Vital Signs BP 136/90 (BP Location: Left Arm)   Pulse (!) 109   Temp 98.7 F (37.1 C) (Oral)   Resp 18   LMP 06/06/2017   SpO2 100%       Physical Exam  Constitutional: She appears well-developed and well-nourished.  Patient is ill-appearing  Eyes: Conjunctivae are normal. Pupils are equal, round, and reactive to light. Right eye exhibits no discharge. Left eye exhibits no discharge. No scleral icterus.  Neck: Normal range of motion. Neck supple.  Cardiovascular: Regular rhythm, normal heart sounds and intact distal pulses. Exam reveals no gallop and no friction rub.  No murmur heard. The patient is tachycardic at rest.  Pulmonary/Chest: Effort normal and breath sounds normal. No stridor. No respiratory distress. She has no wheezes. She has no rales. She exhibits no tenderness.  Abdominal: Soft. She exhibits no distension and no mass. There is no tenderness. There is no rebound and no guarding. No hernia.  Bowel sounds are hyperactive in all  quadrants.  Lymphadenopathy:    She has no cervical adenopathy.  Skin: Skin is warm and dry. She is not diaphoretic.  Nursing note and vitals reviewed.    UC Treatments / Results  Labs (all labs ordered are listed, but only abnormal results are displayed) Labs Reviewed - No data to display  EKG  EKG Interpretation None       Radiology No results found.  Procedures Procedures (including critical care time)  Medications Ordered in UC Medications  0.9 %  sodium chloride infusion ( Intravenous New Bag/Given 07/03/17 1657)  ondansetron (ZOFRAN) injection 4 mg (4 mg Intravenous Given 07/03/17 1642)     Initial Impression / Assessment and Plan / UC Course  I have reviewed the triage vital signs and the nursing notes.  Pertinent labs & imaging results that were available during my care of the patient were reviewed by me and considered in my medical decision making (see chart for details).     1710 - checked on patient.  Zofran was given oral and not IV.  States she is not feeling better yet.  IV going in slowly, raised IV pole and straightened arm, fluids going in wide open.  1745 - checked on patient. All of her fluids had infused. Patient states she is feeling "much better". Discussed the importance of not taking her Tamiflu on an empty stomach. Stressed the importance of rest and fluids and to give her body time to recover.  Final Clinical Impressions(s) / UC Diagnoses   Final diagnoses:  Flu-like symptoms  Nausea and vomiting, intractability of vomiting not specified, unspecified vomiting type    ED Discharge Orders        Ordered    promethazine (PHENERGAN) 25 MG tablet  Every 8 hours PRN     07/03/17 1747       Controlled Substance Prescriptions [place] Controlled Substance Registry consulted? Not Applicable   The usual and customary discharge instructions and warnings were given.  The patient verbalizes understanding and agrees to plan of care.      Nehemiah Settle, NP 07/03/17 1750

## 2017-07-03 NOTE — ED Triage Notes (Signed)
Patient states she was diagnosed with the flu 4 days ago and is still not feeling well. Patient looks fatigued in triage. States fever broke this am. Patient reports mild chest and abdominal pain.

## 2017-07-06 ENCOUNTER — Encounter: Payer: Self-pay | Admitting: Internal Medicine

## 2017-07-06 ENCOUNTER — Ambulatory Visit (INDEPENDENT_AMBULATORY_CARE_PROVIDER_SITE_OTHER): Payer: 59 | Admitting: Internal Medicine

## 2017-07-06 VITALS — BP 137/83 | HR 68 | Temp 98.0°F | Ht 66.5 in | Wt 184.0 lb

## 2017-07-06 DIAGNOSIS — Z79899 Other long term (current) drug therapy: Secondary | ICD-10-CM | POA: Diagnosis not present

## 2017-07-06 DIAGNOSIS — B349 Viral infection, unspecified: Secondary | ICD-10-CM | POA: Diagnosis not present

## 2017-07-06 DIAGNOSIS — I10 Essential (primary) hypertension: Secondary | ICD-10-CM

## 2017-07-06 DIAGNOSIS — G43009 Migraine without aura, not intractable, without status migrainosus: Secondary | ICD-10-CM | POA: Diagnosis not present

## 2017-07-06 MED ORDER — GUAIFENESIN ER 600 MG PO TB12: 600.0000 mg | ORAL_TABLET | Freq: Two times a day (BID) | ORAL | 0 refills | Status: AC

## 2017-07-06 NOTE — Progress Notes (Signed)
   CC: Patient is here to discuss her recent flu infection.  HPI:  [name] is a 32 y.o. female with a past medical history of conditions listed below presenting to the clinic to discuss her recent flu infection.  Hypertension and migraine headaches are also discussed. Please see problem based charting for the status of the patient's current and chronic medical conditions.  Note: Patient has now established care with another primary care clinic and was seen by them yesterday.  She wishes to visit that clinic in the future.  Past Medical History:  Diagnosis Date  . High blood pressure 01/11/2015  . Narcolepsy    per prenatal  . Pregnancy induced hypertension    first preg  . Vaginal Pap smear, abnormal    cryo, ok since   Review of Systems:  Review of Systems  Constitutional: Negative for chills, fever and malaise/fatigue.  Respiratory: Positive for cough and sputum production. Negative for shortness of breath.   Gastrointestinal: Negative for abdominal pain, constipation, diarrhea, nausea and vomiting.  Musculoskeletal: Negative for myalgias.  Neurological: Negative for headaches.    Physical Exam:  Vitals:   07/06/17 1424  BP: 137/83  Pulse: 68  Temp: 98 F (36.7 C)  TempSrc: Oral  SpO2: 100%  Weight: 184 lb (83.5 kg)  Height: 5' 6.5" (1.689 m)   Physical Exam  Constitutional: She is oriented to person, place, and time. She appears well-developed and well-nourished. No distress.  HENT:  Head: Normocephalic and atraumatic.  Mouth/Throat: No oropharyngeal exudate.  Mild oropharyngeal erythema  Eyes: Right eye exhibits no discharge. Left eye exhibits no discharge.  Cardiovascular: Normal rate, regular rhythm and intact distal pulses.  Pulmonary/Chest: Effort normal and breath sounds normal. No respiratory distress. She has no wheezes. She has no rales.  Abdominal: Soft. Bowel sounds are normal. She exhibits no distension. There is no rebound and no  guarding.  Epigastrium mildly tender to palpation  Musculoskeletal: She exhibits no edema.  Lymphadenopathy:    She has no cervical adenopathy.  Neurological: She is alert and oriented to person, place, and time.  Skin: Skin is warm and dry.    Assessment & Plan:   See Encounters Tab for problem based charting.  Patient discussed with Dr. Angelia Mould

## 2017-07-06 NOTE — Patient Instructions (Signed)
Ms. Searson it was nice seeing you today.  -Take Mucinex 600 mg twice daily for cough/ congestion  -Please call the clinic if you experience any more episodes of vomiting.  -Start taking losartan again  -Continue taking Topamax as instructed for headaches

## 2017-07-06 NOTE — Assessment & Plan Note (Signed)
Patient was recently diagnosed with influenza and treated with a 5-day course of Tamiflu which she finished yesterday.  States she was vomiting when she took the medication but the vomiting has now resolved.  She was able to tolerate breakfast in the morning. Patient denies having any nausea, abdominal pain, diarrhea, or constipation.  On exam, noted to have mild epigastric tenderness with no rebound or guarding. She denies having any heartburn or indigestion. States her fatigue has improved. Denies having any fevers, chills, or bodyaches.  She continues to have a cough productive of yellow colored sputum.  Overall, she believes her condition has improved significantly.  Plan -Mucinex 600 mg twice daily for cough/ congestion -Advised her to either call us or her new primary care office if her cough does not resolve or she experiences any further episodes of vomiting.

## 2017-07-06 NOTE — Assessment & Plan Note (Signed)
Losartan was temporarily held when patient had the flu.  Blood pressure 137/83 at this visit.  Plan -Resume losartan 50 mg daily

## 2017-07-06 NOTE — Assessment & Plan Note (Signed)
Patient has now established care with another primary care clinic.  States they are managing her migraine headaches. Her headaches are currently well controlled with Topamax 50 mg during the day and 25 mg at night.  Plan -Continue current regimen

## 2017-07-07 NOTE — Progress Notes (Signed)
Internal Medicine Clinic Attending  Case discussed with Dr. Rathoreat the time of the visit. We reviewed the resident's history and exam and pertinent patient test results. I agree with the assessment, diagnosis, and plan of care documented in the resident's note.  

## 2017-07-21 ENCOUNTER — Ambulatory Visit (INDEPENDENT_AMBULATORY_CARE_PROVIDER_SITE_OTHER): Payer: 59 | Admitting: Allergy and Immunology

## 2017-07-21 ENCOUNTER — Encounter: Payer: Self-pay | Admitting: Allergy and Immunology

## 2017-07-21 VITALS — BP 142/92 | HR 72 | Temp 98.4°F | Resp 20 | Ht 66.0 in | Wt 182.6 lb

## 2017-07-21 DIAGNOSIS — G933 Postviral fatigue syndrome: Secondary | ICD-10-CM

## 2017-07-21 DIAGNOSIS — B999 Unspecified infectious disease: Secondary | ICD-10-CM

## 2017-07-21 DIAGNOSIS — G9331 Postviral fatigue syndrome: Secondary | ICD-10-CM

## 2017-07-21 DIAGNOSIS — G43909 Migraine, unspecified, not intractable, without status migrainosus: Secondary | ICD-10-CM | POA: Diagnosis not present

## 2017-07-21 DIAGNOSIS — J3089 Other allergic rhinitis: Secondary | ICD-10-CM

## 2017-07-21 MED ORDER — MONTELUKAST SODIUM 10 MG PO TABS: 10.0000 mg | ORAL_TABLET | Freq: Every day | ORAL | 5 refills | Status: DC

## 2017-07-21 NOTE — Patient Instructions (Addendum)
  1.  Allergen avoidance measures?  2.  Slowly taper off all forms of caffeine to help with headaches   3.  Treat and prevent inflammation:   A.  Montelukast 10 mg tablet 1 time per day  4.  "Action plan" for "infection":   A.  Nasal saline multiple times a day  B.  OTC Mucinex DM - 2 tablets twice a day  C.  Loratadine 10 mg - 1 tablet twice a day  D.  Ranitidine 150 mg - 1 tablet twice a day  5.  Blood - IgA/G/M, IgE, anti-pneumococcal ab, anti-tetanus ab  6.  Further evaluation and treatment?  Yes if recurrent  7. Return to clinic Summer 2019 or earlier if problem

## 2017-07-21 NOTE — Progress Notes (Signed)
Dear Dr. Luciana Axe,  Thank you for referring Rhonda Bird to the [facility] on 07/21/2017.   Below is a summation of this patient's evaluation and recommendations.  Thank you for your referral. I will keep you informed about this patient's response to treatment.   If you have any questions please do not hesitate to contact me.   Sincerely,  Jiles Prows, MD Allergy / Immunology [facility]   ______________________________________________________________________    NEW PATIENT NOTE  Referring Provider: Bartholome Bill, MD Primary Provider: Shela Leff, MD Date of office visit: 07/21/2017    Subjective:   Chief Complaint:  Rhonda Bird (DOB: [birth year]) is a 32 y.o. female who presents to the clinic on 07/21/2017 with a chief complaint of Allergies; Allergy Testing; and Cough .     HPI: Rhonda Bird presents to this clinic in evaluation of recurrent respiratory tract symptoms and headache.  Apparently she has been developing recurrent upper respiratory tract infections with nasal congestion and occasionally a ugly postnasal drip and a cough.  She states that she was sick "half of the year" in 2018 and she received at least 4 antibiotics for this issue.  Most recently she contracted influenza requiring the administration of Tamiflu.  She has had a prolonged cough as a result of that infection since 1 February which is definitely improving the past few weeks.  If she is not sick with a infection she does not have any recurrent and persistent respiratory tract symptoms.  Specifically, she does not have an exercise-induced bronchospastic history or cold air induced bronchospastic history and she does not have issues with anosmia or ugly nasal discharge.  She does not have sneezing or intermittent nasal congestion and no issues acting around her eyes.  Likewise, there is  not a history of postnasal drip or throat clearing or raspy voice or symptoms suggesting reflux if she is not sick..  She has been treated with antibiotics as well as agents directed against "allergies".  These have included the administration of antihistamines and occasionally a nasal steroid.  In addition she has migraines presenting as unilateral face and periorbital pounding pain without any scotoma but with definite nausea usually requiring her to lay down for relief that can sometimes last a day or more.  Originally these were occurring on a daily basis and she was subsequently treated for systemic arterial hypertension with a diminution in the frequency of her headaches to every other day and recently was treated with Topamax and now her headaches every third day.  It should be noted that she drinks [place] every day and has chocolate around the time of her menstrual period.   Past Medical History:  Diagnosis Date  . High blood pressure 01/11/2015  . Narcolepsy    per prenatal  . Pregnancy induced hypertension    first preg  . Vaginal Pap smear, abnormal    cryo, ok since    Past Surgical History:  Procedure Laterality Date  . DILATION AND CURETTAGE OF UTERUS    . TONSILLECTOMY      Allergies as of 07/21/2017      Reactions   Penicillins Hives      Medication List      clindamycin 2 % vaginal cream Commonly known as:  CLEOCIN Place 1 Applicatorful vaginally at bedtime.   fluconazole 150 MG tablet Commonly known as:  DIFLUCAN Take 1 tablet  today repeat in 72 hours   fluticasone 50 MCG/ACT nasal spray Commonly known as:  FLONASE PLACE 1 SPRAY DAILY INTO BOTH NOSTRILS.   guaiFENesin 600 MG 12 hr tablet Commonly known as:  MUCINEX Take 1 tablet (600 mg total) by mouth 2 (two) times daily.   ketoconazole 2 % shampoo Commonly known as:  NIZORAL Apply 1 application topically 2 (two) times a week.   losartan 50 MG tablet Commonly known as:  COZAAR Take 1 tablet  (50 mg total) by mouth daily.   promethazine 25 MG tablet Commonly known as:  PHENERGAN Take 1 tablet (25 mg total) by mouth every 8 (eight) hours as needed for nausea or vomiting.   rizatriptan 10 MG disintegrating tablet Commonly known as:  MAXALT-MLT Take by mouth.   sodium chloride 0.65 % Soln nasal spray Commonly known as:  OCEAN Place 1 spray into both nostrils as needed for congestion.   topiramate 25 MG tablet Commonly known as:  TOPAMAX Take 2 tablets (50 mg total) by mouth at bedtime.       Review of systems negative except as noted in HPI / PMHx or noted below:  Review of Systems  Constitutional: Negative.   HENT: Negative.   Eyes: Negative.   Respiratory: Negative.   Cardiovascular: Negative.   Gastrointestinal: Negative.   Genitourinary: Negative.   Musculoskeletal: Negative.   Skin: Negative.   Neurological: Negative.   Endo/Heme/Allergies: Negative.   Psychiatric/Behavioral: Negative.     Family History  Adopted: Yes  Problem Relation Age of Onset  . Other Neg Hx     Social History   Socioeconomic History  . Marital status: Single    Spouse name: Not on file  . Number of children: Not on file  . Years of education: Not on file  . Highest education level: Not on file  Social Needs  . Financial resource strain: Not on file  . Food insecurity - worry: Not on file  . Food insecurity - inability: Not on file  . Transportation needs - medical: Not on file  . Transportation needs - non-medical: Not on file  Occupational History  . Not on file  Tobacco Use  . Smoking status: Never Smoker  . Smokeless tobacco: Never Used  Substance and Sexual Activity  . Alcohol use: No    Comment: socially  . Drug use: No  . Sexual activity: Yes    Birth control/protection: None, OCP  Other Topics Concern  . Not on file  Social History Narrative  . Not on file    Environmental and Social history  Lives in a house with a dry environment, no animals  located inside the household, carpet in the bedroom, plastic on the bed, plastic on the pillow, no smokers located inside the household, and employment in an office setting.  Objective:   Vitals:   07/21/17 0839  BP: (!) 142/92  Pulse: 72  Resp: 20  Temp: 98.4 F (36.9 C)   Height: 5\' 6"  (167.6 cm) Weight: 182 lb 9.6 oz (82.8 kg)  Physical Exam  Constitutional: She is well-developed, well-nourished, and in no distress.  HENT:  Head: Normocephalic.  Right Ear: Tympanic membrane, external ear and ear canal normal.  Left Ear: Tympanic membrane, external ear and ear canal normal.  Nose: Nose normal. No mucosal edema or rhinorrhea.  Mouth/Throat: Uvula is midline, oropharynx is clear and moist and mucous membranes are normal. No oropharyngeal exudate.  Eyes: Conjunctivae are normal.  Neck: Trachea normal. No  tracheal tenderness present. No tracheal deviation present. No thyromegaly present.  Cardiovascular: Normal rate, regular rhythm, S1 normal, S2 normal and normal heart sounds.  No murmur heard. Pulmonary/Chest: Breath sounds normal. No stridor. No respiratory distress. She has no wheezes. She has no rales.  Musculoskeletal: She exhibits no edema.  Lymphadenopathy:       Head (right side): No tonsillar adenopathy present.       Head (left side): No tonsillar adenopathy present.    She has no cervical adenopathy.  Neurological: She is alert. Gait normal.  Skin: No rash noted. She is not diaphoretic. No erythema. Nails show no clubbing.  Psychiatric: Mood and affect normal.    Diagnostics: Allergy skin tests were performed.  She did not demonstrate any hypersensitivity against a screening panel of aeroallergens or foods.  Spirometry was performed and demonstrated an FEV1 of 3.11 @ 108 % of predicted. FEV1/FVC = 0.84.  Following the administration of nebulized albuterol her FEV1 did not improve significantly   Results of blood tests obtained 24 November 2016 identified WBC 7.2,  absolute eosinophil 200, absolute lymphocyte 1900, hemoglobin 12.9, platelet 238.  Assessment and Plan:    1. Other allergic rhinitis   2. Recurrent infections   3. Post-influenza syndrome   4. Migraine syndrome     1.  Allergen avoidance measures?  2.  Slowly taper off all forms of caffeine to help with headaches   3.  Treat and prevent inflammation:   A.  Montelukast 10 mg tablet 1 time per day  4.  "Action plan" for "infection":   A.  Nasal saline multiple times a day  B.  OTC Mucinex DM - 2 tablets twice a day  C.  Loratadine 10 mg - 1 tablet twice a day  D.  Ranitidine 150 mg - 1 tablet twice a day  5.  Blood - IgA/G/M, IgE, anti-pneumococcal ab, anti-tetanus ab  6.  Further evaluation and treatment?  Yes if recurrent  7. Return to clinic Summer 2019 or earlier if problem  There does not appear to be much evidence that atopic disease is driving Verne's respiratory tract symptoms.  She does have a rather significant frequency of infections and we will screen her blood to make sure that she can generate a antigen specific antibody response against pathogens.  I did recommend that she utilize an anti-inflammatory therapy for her respiratory tract in the form of a leukotriene modifier on a regular basis.  There is a history consistent with a component of reflux induced respiratory disease but this only appears to present itself it points in time when she develops a cough from some other issue such as an infection.  She also has rather significant migraines and I had a talk with her today about the need to eliminate all caffeine consumption and to undergo a tapering plan that allows her to come off all caffeine within the next 4 weeks.  She will keep in contact with me noting her response to this approach.  I will contact her with the results of blood tests once they are available for review.  Jiles Prows, MD Allergy / Immunology [facility]

## 2017-07-22 ENCOUNTER — Encounter: Payer: Self-pay | Admitting: Allergy and Immunology

## 2017-07-23 NOTE — Addendum Note (Signed)
Addended by: Herbie Drape on: 07/23/2017 08:11 AM   Modules accepted: Orders

## 2017-07-27 LAB — DIPHTHERIA / TETANUS ANTIBODY PANEL
DIPHTHERIA AB: 1.76 [IU]/mL (ref <0.10)
Tetanus Ab, IgG: 4.77 IU/mL (ref <0.10)

## 2017-07-27 LAB — IGE: IgE (Immunoglobulin E), Serum: 9 IU/mL (ref 0–100)

## 2017-07-27 LAB — STREP PNEUMONIAE 23 SEROTYPES IGG
PNEUMO AB TYPE 14: 0.7 ug/mL — AB (ref >1.3)
PNEUMO AB TYPE 20: 0.4 ug/mL — AB (ref >1.3)
PNEUMO AB TYPE 34 (10A): 2.5 ug/mL (ref >1.3)
PNEUMO AB TYPE 51 (7F): 0.2 ug/mL — AB (ref >1.3)
PNEUMO AB TYPE 57 (19A): 0.5 ug/mL — AB (ref >1.3)
PNEUMO AB TYPE 5: 1.4 ug/mL (ref >1.3)
Pneumo Ab Type 1*: 0.8 ug/mL — ABNORMAL LOW (ref >1.3)
Pneumo Ab Type 12 (12F)*: <0.1 ug/mL — ABNORMAL LOW (ref >1.3)
Pneumo Ab Type 17 (17F)*: 0.9 ug/mL — ABNORMAL LOW (ref >1.3)
Pneumo Ab Type 19 (19F)*: 0.5 ug/mL — ABNORMAL LOW (ref >1.3)
Pneumo Ab Type 2*: 1 ug/mL — ABNORMAL LOW (ref >1.3)
Pneumo Ab Type 22 (22F)*: 0.3 ug/mL — ABNORMAL LOW (ref >1.3)
Pneumo Ab Type 23 (23F)*: <0.1 ug/mL — ABNORMAL LOW (ref >1.3)
Pneumo Ab Type 3*: 1.1 ug/mL — ABNORMAL LOW (ref >1.3)
Pneumo Ab Type 4*: <0.1 ug/mL — ABNORMAL LOW (ref >1.3)
Pneumo Ab Type 54 (15B)*: <0.1 ug/mL — ABNORMAL LOW (ref >1.3)
Pneumo Ab Type 56 (18C)*: <0.1 ug/mL — ABNORMAL LOW (ref >1.3)
Pneumo Ab Type 68 (9V)*: <0.1 ug/mL — ABNORMAL LOW (ref >1.3)
Pneumo Ab Type 70 (33F)*: 0.6 ug/mL — ABNORMAL LOW (ref >1.3)

## 2017-07-27 LAB — IGG, IGA, IGM
IGG (IMMUNOGLOBIN G), SERUM: 1001 mg/dL (ref 700–1600)
IGM (IMMUNOGLOBULIN M), SRM: 156 mg/dL (ref 26–217)
IgA/Immunoglobulin A, Serum: 172 mg/dL (ref 87–352)

## 2017-07-27 NOTE — Addendum Note (Signed)
Addended by: Herbie Drape on: 07/27/2017 08:43 AM   Modules accepted: Orders

## 2017-07-30 NOTE — Addendum Note (Signed)
Addended by: Lucrezia Starch I on: 07/30/2017 07:18 AM   Modules accepted: Orders

## 2017-08-03 ENCOUNTER — Telehealth: Payer: Self-pay | Admitting: Internal Medicine

## 2017-08-03 NOTE — Telephone Encounter (Signed)
Rec'd a call regards to the forms completed for this patient for 06/30/2017-07/02/2017  Requesting a Clarification be address in order for her Form to be processed before 08/12/2017.  Please Advise

## 2017-08-04 NOTE — Telephone Encounter (Signed)
What is it that she needs? Does she need someone to call her or her employer?

## 2017-08-05 ENCOUNTER — Ambulatory Visit (INDEPENDENT_AMBULATORY_CARE_PROVIDER_SITE_OTHER): Payer: 59 | Admitting: *Deleted

## 2017-08-05 DIAGNOSIS — Z23 Encounter for immunization: Secondary | ICD-10-CM | POA: Diagnosis not present

## 2017-08-05 DIAGNOSIS — B999 Unspecified infectious disease: Secondary | ICD-10-CM

## 2017-08-05 MED ORDER — PNEUMOCOCCAL VAC POLYVALENT 25 MCG/0.5ML IJ INJ
0.5000 mL | INJECTION | Freq: Once | INTRAMUSCULAR | Status: AC
  Administered 2017-08-05: 0.5 mL via INTRAMUSCULAR

## 2017-08-05 NOTE — Progress Notes (Deleted)
Subjective:     Patient ID: Rhonda Bird, female   DOB: 1985/12/23, 32 y.o.   MRN: [id number]  HPI   Review of Systems     Objective:   Physical Exam     Assessment:     ***    Plan:     ***

## 2017-08-05 NOTE — Progress Notes (Signed)
Patient came in to receive Pneumococcal Vaccine  23 given left deltoid and VIS given consent signed. Patient did wait 15 min post injection without any problems.  Lot: [id number] Exp: 11/07/2018 Advised patient to come in to get blood drawn in 6 weeks for repeat titers. Patient verbalized understanding. Lab ordered placed on [facility] desk

## 2017-08-31 ENCOUNTER — Other Ambulatory Visit: Payer: Self-pay | Admitting: *Deleted

## 2017-08-31 NOTE — Telephone Encounter (Signed)
Received refill request from [facility] for topiramate 25mg  tabs-per pt's medical record, she has transferred her care to another office.  Will inform pharmacy.Regenia Skeeter, Darlene Cassady4/2/20199:51 AM

## 2017-09-22 ENCOUNTER — Other Ambulatory Visit: Payer: Self-pay

## 2017-09-22 MED ORDER — MONTELUKAST SODIUM 10 MG PO TABS: 10.0000 mg | ORAL_TABLET | Freq: Every day | ORAL | 1 refills | Status: AC

## 2018-05-29 ENCOUNTER — Other Ambulatory Visit: Payer: Self-pay | Admitting: Internal Medicine

## 2018-05-29 DIAGNOSIS — I10 Essential (primary) hypertension: Secondary | ICD-10-CM

## 2019-10-13 ENCOUNTER — Other Ambulatory Visit: Payer: 59

## 2019-10-14 ENCOUNTER — Ambulatory Visit: Payer: Medicaid Other | Attending: Internal Medicine

## 2019-10-14 DIAGNOSIS — Z23 Encounter for immunization: Secondary | ICD-10-CM

## 2019-10-14 NOTE — Progress Notes (Signed)
   Covid-19 Vaccination Clinic  Name:  Rhonda Bird    MRN: [id number] DOB: 1986/01/09  10/14/2019  [name] was observed post Covid-19 immunization for 15 minutes without incident. She was provided with Vaccine Information Sheet and instruction to access the V-Safe system.   [name] was instructed to call 911 with any severe reactions post vaccine: Marland Kitchen Difficulty breathing  . Swelling of face and throat  . A fast heartbeat  . A bad rash all over body  . Dizziness and weakness   Immunizations Administered    Name Date Dose VIS Date Route   Pfizer COVID-19 Vaccine 10/14/2019 11:16 AM 0.3 mL 07/26/2018 Intramuscular   Manufacturer: [place]   Lot: [id number]   [place]: [id number]

## 2019-10-16 ENCOUNTER — Other Ambulatory Visit: Payer: 59

## 2019-11-06 ENCOUNTER — Ambulatory Visit: Payer: Medicaid Other | Attending: Internal Medicine

## 2019-11-06 DIAGNOSIS — Z23 Encounter for immunization: Secondary | ICD-10-CM

## 2019-11-06 NOTE — Progress Notes (Signed)
   Covid-19 Vaccination Clinic  Name:  Rhonda Bird    MRN: [id number] DOB: 08-17-1985  11/06/2019  [name] was observed post Covid-19 immunization for 15 minutes without incident. She was provided with Vaccine Information Sheet and instruction to access the V-Safe system.   [name] was instructed to call 911 with any severe reactions post vaccine: Marland Kitchen Difficulty breathing  . Swelling of face and throat  . A fast heartbeat  . A bad rash all over body  . Dizziness and weakness   Immunizations Administered    Name Date Dose VIS Date Route   Pfizer COVID-19 Vaccine 11/06/2019 11:49 AM 0.3 mL 07/26/2018 Intramuscular   Manufacturer: [employer]   Lot: [id number]   [place]: [address]-7

## 2019-12-22 ENCOUNTER — Other Ambulatory Visit: Payer: Medicaid Other

## 2020-10-15 ENCOUNTER — Ambulatory Visit: Payer: Medicaid Other | Admitting: Physician Assistant

## 2021-01-22 ENCOUNTER — Encounter: Payer: Self-pay | Admitting: Physician Assistant

## 2021-01-22 ENCOUNTER — Ambulatory Visit (INDEPENDENT_AMBULATORY_CARE_PROVIDER_SITE_OTHER): Payer: Medicaid Other | Admitting: Physician Assistant

## 2021-01-22 ENCOUNTER — Other Ambulatory Visit: Payer: Self-pay

## 2021-01-22 DIAGNOSIS — B351 Tinea unguium: Secondary | ICD-10-CM | POA: Diagnosis not present

## 2021-01-22 DIAGNOSIS — L700 Acne vulgaris: Secondary | ICD-10-CM | POA: Diagnosis not present

## 2021-01-22 DIAGNOSIS — L219 Seborrheic dermatitis, unspecified: Secondary | ICD-10-CM | POA: Diagnosis not present

## 2021-01-22 MED ORDER — CLINDAMYCIN PHOSPHATE 1 % EX GEL: Freq: Every day | CUTANEOUS | 1 refills | Status: DC

## 2021-01-22 MED ORDER — ALCLOMETASONE DIPROPIONATE 0.05 % EX CREA: TOPICAL_CREAM | CUTANEOUS | 3 refills | Status: AC

## 2021-01-22 MED ORDER — KETOCONAZOLE 2 % EX SHAM
1.0000 | MEDICATED_SHAMPOO | CUTANEOUS | 0 refills | Status: AC
Start: 2021-01-23 — End: ?

## 2021-01-22 MED ORDER — NONFORMULARY OR COMPOUNDED ITEM
2 refills | Status: AC
Start: 2021-01-22 — End: ?

## 2021-01-22 NOTE — Progress Notes (Signed)
   Follow-Up Visit   Subjective  Rhonda Bird is a 35 y.o. female who presents for the following: Follow-up (Needs refill on her scalp medication for fungus. Ketoconazole, clobetasol or exelderm sol. Patient is not  sure which one. Also check right big toenail it is painted but patient said she thinks it could be fungus?).   The following portions of the chart were reviewed this encounter and updated as appropriate:  Tobacco  Allergies  Meds  Problems  Med Hx  Surg Hx  Fam Hx      Objective  Well appearing patient in no apparent distress; mood and affect are within normal limits.  All skin waist up examined.  right great  toenail White discoloration on right great toenail with no thickening.   Assessment & Plan  Acne vulgaris Head  clindamycin (CLINDAGEL) 1 % gel - Head Apply topically daily.  Seborrheic dermatitis Central face/ scalp  alclomethasone (ACLOVATE) 0.05 % cream - Head Apply to affected area qd to prn  Related Medications ketoconazole (NIZORAL) 2 % shampoo Apply 1 application topically 2 (two) times a week.  Onychomycosis right great  toenail  Topical fluconazole in DMSO compound given to patient to take and have it made   NONFORMULARY OR COMPOUNDED ITEM - right great  toenail Diflucan 200 mg crushed in DMSO 30 ml  Apply 1-2 drops under each nail  # 2 oz    I, Natalija Mavis, PA-C, have reviewed all documentation's for this visit.  The documentation on 01/22/21 for the exam, diagnosis, procedures and orders are all accurate and complete.

## 2021-02-05 ENCOUNTER — Telehealth: Payer: Self-pay | Admitting: *Deleted

## 2021-02-05 MED ORDER — SPIRONOLACTONE 25 MG PO TABS: 25.0000 mg | ORAL_TABLET | Freq: Every day | ORAL | 6 refills | Status: DC

## 2021-02-05 NOTE — Telephone Encounter (Signed)
After being seen last week and doing some research patient has decided she wants to proceed with the spironolactone.

## 2021-03-31 ENCOUNTER — Ambulatory Visit (INDEPENDENT_AMBULATORY_CARE_PROVIDER_SITE_OTHER): Payer: Medicaid Other | Admitting: Physician Assistant

## 2021-03-31 ENCOUNTER — Encounter: Payer: Self-pay | Admitting: Physician Assistant

## 2021-03-31 ENCOUNTER — Other Ambulatory Visit: Payer: Self-pay

## 2021-03-31 DIAGNOSIS — L700 Acne vulgaris: Secondary | ICD-10-CM | POA: Diagnosis not present

## 2021-03-31 MED ORDER — SPIRONOLACTONE 50 MG PO TABS: 50.0000 mg | ORAL_TABLET | Freq: Every day | ORAL | 2 refills | Status: DC

## 2021-04-02 ENCOUNTER — Ambulatory Visit (INDEPENDENT_AMBULATORY_CARE_PROVIDER_SITE_OTHER): Payer: Medicaid Other | Admitting: *Deleted

## 2021-04-02 ENCOUNTER — Other Ambulatory Visit: Payer: Self-pay

## 2021-04-02 ENCOUNTER — Ambulatory Visit: Payer: Medicaid Other | Admitting: Physician Assistant

## 2021-04-02 ENCOUNTER — Telehealth: Payer: Self-pay | Admitting: *Deleted

## 2021-04-02 ENCOUNTER — Encounter: Payer: Self-pay | Admitting: Physician Assistant

## 2021-04-02 VITALS — Wt 182.0 lb

## 2021-04-02 DIAGNOSIS — L700 Acne vulgaris: Secondary | ICD-10-CM

## 2021-04-02 LAB — POCT URINE PREGNANCY: Preg Test, Ur: NEGATIVE

## 2021-04-02 NOTE — Progress Notes (Signed)
Patient was able to start birth control pill so now that she has 2 forms of  birth control to start isotretinoin.  Patient registered as new start in [place].

## 2021-04-02 NOTE — Telephone Encounter (Signed)
Patient called OBGYN and now has 2 forms of birth control to start isotretinoin. Patient will come by today for nurse to see to sign up for isotretinoin.

## 2021-04-02 NOTE — Progress Notes (Signed)
   Follow-Up Visit   Subjective  Rhonda Bird is a 35 y.o. female who presents for the following: Acne (Face, chest & back- tx- spironolactone- not much help).   The following portions of the chart were reviewed this encounter and updated as appropriate:  Tobacco  Allergies  Meds  Problems  Med Hx  Surg Hx  Fam Hx      Objective  Well appearing patient in no apparent distress; mood and affect are within normal limits.  A focused examination was performed including face. Relevant physical exam findings are noted in the Assessment and Plan.  Left Malar Cheek, Mid Forehead, Right Malar Cheek Large cysts with hyperpigmenation.   Assessment & Plan  Acne vulgaris Mid Forehead; Left Malar Cheek; Right Malar Cheek  Will start isotretinoin once patient gets 2 forms of birth control   spironolactone (ALDACTONE) 50 MG tablet - Left Malar Cheek, Mid Forehead, Right Malar Cheek Take 1 tablet (50 mg total) by mouth daily.   Related Medications clindamycin (CLINDAGEL) 1 % gel Apply topically daily.    I, Keithen Capo, PA-C, have reviewed all documentation's for this visit.  The documentation on 04/02/21 for the exam, diagnosis, procedures and orders are all accurate and complete.

## 2021-05-06 ENCOUNTER — Ambulatory Visit (INDEPENDENT_AMBULATORY_CARE_PROVIDER_SITE_OTHER): Payer: Medicaid Other | Admitting: Physician Assistant

## 2021-05-06 ENCOUNTER — Encounter: Payer: Self-pay | Admitting: Physician Assistant

## 2021-05-06 ENCOUNTER — Other Ambulatory Visit: Payer: Self-pay

## 2021-05-06 DIAGNOSIS — L700 Acne vulgaris: Secondary | ICD-10-CM | POA: Diagnosis not present

## 2021-05-06 DIAGNOSIS — Z5181 Encounter for therapeutic drug level monitoring: Secondary | ICD-10-CM | POA: Diagnosis not present

## 2021-05-06 MED ORDER — ISOTRETINOIN 40 MG PO CAPS: 40.0000 mg | ORAL_CAPSULE | Freq: Every day | ORAL | 0 refills | Status: DC

## 2021-05-06 NOTE — Progress Notes (Signed)
   Follow-Up Visit   Subjective  Rhonda Bird is a 35 y.o. female who presents for the following: Acne (Patient here today for 31 day follow up).   The following portions of the chart were reviewed this encounter and updated as appropriate:  Tobacco  Allergies  Meds  Problems  Med Hx  Surg Hx  Fam Hx      Objective  Well appearing patient in no apparent distress; mood and affect are within normal limits.  All skin waist up examined.  face, chest and back Erythematous papules and pustules with comedones          Assessment & Plan  Acne vulgaris face, chest and back  CBC with Differential/Platelet - face, chest and back  Comprehensive metabolic panel - face, chest and back  Lipid panel - face, chest and back  hCG, serum, qualitative - face, chest and back  ISOtretinoin (ACCUTANE) 40 MG capsule - face, chest and back Take 1 capsule (40 mg total) by mouth daily.  Related Medications clindamycin (CLINDAGEL) 1 % gel Apply topically daily.  spironolactone (ALDACTONE) 50 MG tablet Take 1 tablet (50 mg total) by mouth daily.  Therapeutic drug monitoring  Related Procedures CBC with Differential/Platelet Comprehensive metabolic panel Lipid panel hCG, serum, qualitative  Related Medications ISOtretinoin (ACCUTANE) 40 MG capsule Take 1 capsule (40 mg total) by mouth daily.   I, Jahvon Gosline, PA-C, have reviewed all documentation's for this visit.  The documentation on 05/06/21 for the exam, diagnosis, procedures and orders are all accurate and complete.

## 2021-05-07 ENCOUNTER — Telehealth: Payer: Self-pay | Admitting: *Deleted

## 2021-05-07 DIAGNOSIS — L700 Acne vulgaris: Secondary | ICD-10-CM

## 2021-05-07 DIAGNOSIS — Z5181 Encounter for therapeutic drug level monitoring: Secondary | ICD-10-CM

## 2021-05-07 LAB — CBC WITH DIFFERENTIAL/PLATELET
Absolute Monocytes: 518 cells/uL (ref 200–950)
Basophils Absolute: 37 cells/uL (ref 0–200)
Basophils Relative: 0.5 %
Eosinophils Absolute: 52 cells/uL (ref 15–500)
Eosinophils Relative: 0.7 %
HCT: 40.1 % (ref 35.0–45.0)
Hemoglobin: 13 g/dL (ref 11.7–15.5)
Lymphs Abs: 2716 cells/uL (ref 850–3900)
MCH: 29.7 pg (ref 27.0–33.0)
MCHC: 32.4 g/dL (ref 32.0–36.0)
MCV: 91.6 fL (ref 80.0–100.0)
MPV: 11.2 fL (ref 7.5–12.5)
Monocytes Relative: 7 %
Neutro Abs: 4077 cells/uL (ref 1500–7800)
Neutrophils Relative %: 55.1 %
Platelets: 246 10*3/uL (ref 140–400)
RBC: 4.38 10*6/uL (ref 3.80–5.10)
RDW: 13.3 % (ref 11.0–15.0)
Total Lymphocyte: 36.7 %
WBC: 7.4 10*3/uL (ref 3.8–10.8)

## 2021-05-07 LAB — LIPID PANEL
Cholesterol: 137 mg/dL (ref <200)
HDL: 57 mg/dL (ref >=50)
LDL Cholesterol (Calc): 66 mg/dL (calc)
Non-HDL Cholesterol (Calc): 80 mg/dL (calc) (ref <130)
Total CHOL/HDL Ratio: 2.4 (calc) (ref <5.0)
Triglycerides: 62 mg/dL (ref <150)

## 2021-05-07 LAB — COMPREHENSIVE METABOLIC PANEL
AG Ratio: 1.8 (calc) (ref 1.0–2.5)
ALT: 11 U/L (ref 6–29)
AST: 16 U/L (ref 10–30)
Albumin: 4.6 g/dL (ref 3.6–5.1)
Alkaline phosphatase (APISO): 59 U/L (ref 31–125)
BUN: 14 mg/dL (ref 7–25)
CO2: 25 mmol/L (ref 20–32)
Calcium: 9.5 mg/dL (ref 8.6–10.2)
Chloride: 105 mmol/L (ref 98–110)
Creat: 0.78 mg/dL (ref 0.50–0.97)
Globulin: 2.5 g/dL (calc) (ref 1.9–3.7)
Glucose, Bld: 85 mg/dL (ref 65–139)
Potassium: 4.6 mmol/L (ref 3.5–5.3)
Sodium: 140 mmol/L (ref 135–146)
Total Bilirubin: 0.3 mg/dL (ref 0.2–1.2)
Total Protein: 7.1 g/dL (ref 6.1–8.1)

## 2021-05-07 LAB — HCG, SERUM, QUALITATIVE: Preg, Serum: NEGATIVE

## 2021-05-07 MED ORDER — ISOTRETINOIN 40 MG PO CAPS: 40.0000 mg | ORAL_CAPSULE | Freq: Two times a day (BID) | ORAL | 0 refills | Status: AC

## 2021-05-07 NOTE — Telephone Encounter (Signed)
Called [facility] to see why only 30 capsules were dispensed- the sig directions stated one by mouth daily- but 60 capsules were sent in. Pharmacy states that patient is bringing back medication and they will give her 60 capsules.

## 2021-05-07 NOTE — Addendum Note (Signed)
Addended by: Warren Danes on: 05/07/2021 03:45 PM   Modules accepted: Orders

## 2021-06-10 ENCOUNTER — Other Ambulatory Visit: Payer: Self-pay

## 2021-06-10 ENCOUNTER — Encounter: Payer: Self-pay | Admitting: Physician Assistant

## 2021-06-10 ENCOUNTER — Ambulatory Visit (INDEPENDENT_AMBULATORY_CARE_PROVIDER_SITE_OTHER): Payer: Medicaid Other | Admitting: Physician Assistant

## 2021-06-10 DIAGNOSIS — L700 Acne vulgaris: Secondary | ICD-10-CM | POA: Diagnosis not present

## 2021-06-10 DIAGNOSIS — Z5181 Encounter for therapeutic drug level monitoring: Secondary | ICD-10-CM

## 2021-06-10 DIAGNOSIS — L080 Pyoderma: Secondary | ICD-10-CM

## 2021-06-10 MED ORDER — MUPIROCIN 2 % EX OINT: TOPICAL_OINTMENT | CUTANEOUS | 0 refills | Status: AC

## 2021-06-10 MED ORDER — ISOTRETINOIN 40 MG PO CAPS: 40.0000 mg | ORAL_CAPSULE | Freq: Two times a day (BID) | ORAL | 0 refills | Status: AC

## 2021-06-10 NOTE — Progress Notes (Signed)
° °  Follow-Up Visit   Subjective  Rhonda Bird is a 36 y.o. female who presents for the following: Follow-up (Follow up 31 days ).   The following portions of the chart were reviewed this encounter and updated as appropriate:  Tobacco   Allergies   Meds   Problems   Med Hx   Surg Hx   Fam Hx       Objective  Well appearing patient in no apparent distress; mood and affect are within normal limits.  A focused examination was performed including face. Relevant physical exam findings are noted in the Assessment and Plan.  Head - Anterior (Face) Mostly clear with post inflammatory hyperpigmentation and oil production.   Right Ear lobe Erythema and pink scale   Assessment & Plan  Acne vulgaris Head - Anterior (Face)  ISOtretinoin (ACCUTANE) 40 MG capsule - Head - Anterior (Face) Take 1 capsule (40 mg total) by mouth 2 (two) times daily.  CBC with Differential/Platelet - Head - Anterior (Face)  Comprehensive metabolic panel - Head - Anterior (Face)  Lipid panel - Head - Anterior (Face)  hCG, serum, qualitative - Head - Anterior (Face)  Therapeutic drug monitoring  Related Procedures CBC with Differential/Platelet Comprehensive metabolic panel Lipid panel hCG, serum, qualitative  Pyoderma Right Ear lobe  mupirocin ointment (BACTROBAN) 2 % - Right Ear lobe APPLY TO EAR DAILY    I, Edger Husain, PA-C, have reviewed all documentation's for this visit.  The documentation on 06/10/21 for the exam, diagnosis, procedures and orders are all accurate and complete.

## 2021-06-11 LAB — CBC WITH DIFFERENTIAL/PLATELET
Absolute Monocytes: 360 cells/uL (ref 200–950)
Basophils Absolute: 31 cells/uL (ref 0–200)
Basophils Relative: 0.5 %
Eosinophils Absolute: 37 cells/uL (ref 15–500)
Eosinophils Relative: 0.6 %
HCT: 37.3 % (ref 35.0–45.0)
Hemoglobin: 12.4 g/dL (ref 11.7–15.5)
Lymphs Abs: 2598 cells/uL (ref 850–3900)
MCH: 30 pg (ref 27.0–33.0)
MCHC: 33.2 g/dL (ref 32.0–36.0)
MCV: 90.3 fL (ref 80.0–100.0)
MPV: 11.4 fL (ref 7.5–12.5)
Monocytes Relative: 5.8 %
Neutro Abs: 3174 cells/uL (ref 1500–7800)
Neutrophils Relative %: 51.2 %
Platelets: 279 10*3/uL (ref 140–400)
RBC: 4.13 10*6/uL (ref 3.80–5.10)
RDW: 13.6 % (ref 11.0–15.0)
Total Lymphocyte: 41.9 %
WBC: 6.2 10*3/uL (ref 3.8–10.8)

## 2021-06-11 LAB — COMPREHENSIVE METABOLIC PANEL
AG Ratio: 1.5 (calc) (ref 1.0–2.5)
ALT: 14 U/L (ref 6–29)
AST: 21 U/L (ref 10–30)
Albumin: 4.2 g/dL (ref 3.6–5.1)
Alkaline phosphatase (APISO): 68 U/L (ref 31–125)
BUN: 11 mg/dL (ref 7–25)
CO2: 25 mmol/L (ref 20–32)
Calcium: 9.4 mg/dL (ref 8.6–10.2)
Chloride: 105 mmol/L (ref 98–110)
Creat: 0.84 mg/dL (ref 0.50–0.97)
Globulin: 2.8 g/dL (calc) (ref 1.9–3.7)
Glucose, Bld: 76 mg/dL (ref 65–99)
Potassium: 4.1 mmol/L (ref 3.5–5.3)
Sodium: 139 mmol/L (ref 135–146)
Total Bilirubin: 0.4 mg/dL (ref 0.2–1.2)
Total Protein: 7 g/dL (ref 6.1–8.1)

## 2021-06-11 LAB — LIPID PANEL
Cholesterol: 164 mg/dL (ref <200)
HDL: 42 mg/dL — ABNORMAL LOW (ref >=50)
LDL Cholesterol (Calc): 106 mg/dL (calc) — ABNORMAL HIGH
Non-HDL Cholesterol (Calc): 122 mg/dL (calc) (ref <130)
Total CHOL/HDL Ratio: 3.9 (calc) (ref <5.0)
Triglycerides: 70 mg/dL (ref <150)

## 2021-06-11 LAB — HCG, SERUM, QUALITATIVE: Preg, Serum: NEGATIVE

## 2021-07-14 ENCOUNTER — Encounter: Payer: Self-pay | Admitting: Dermatology

## 2021-07-14 ENCOUNTER — Ambulatory Visit (INDEPENDENT_AMBULATORY_CARE_PROVIDER_SITE_OTHER): Payer: Medicaid Other | Admitting: Dermatology

## 2021-07-14 ENCOUNTER — Other Ambulatory Visit: Payer: Self-pay

## 2021-07-14 DIAGNOSIS — Z5181 Encounter for therapeutic drug level monitoring: Secondary | ICD-10-CM | POA: Diagnosis not present

## 2021-07-14 DIAGNOSIS — L700 Acne vulgaris: Secondary | ICD-10-CM | POA: Diagnosis not present

## 2021-07-14 MED ORDER — ISOTRETINOIN 40 MG PO CAPS: 40.0000 mg | ORAL_CAPSULE | Freq: Two times a day (BID) | ORAL | 0 refills | Status: DC

## 2021-07-15 ENCOUNTER — Encounter: Payer: Self-pay | Admitting: Dermatology

## 2021-07-15 LAB — PREGNANCY, URINE: Preg Test, Ur: NEGATIVE

## 2021-07-15 NOTE — Progress Notes (Signed)
° °  Isotretinoin Follow-Up Visit   Subjective  Rhonda Bird is a 36 y.o. female who presents for the following: Acne (31 day isotretinoin f/u).  Acne on isotretinoin Location:  Duration:  Quality:  Associated Signs/Symptoms: Modifying Factors:  Severity:  Timing: Context:   The following portions of the chart were reviewed this encounter and updated as appropriate:       Isotretinoin F/U - 07/14/21 0900       Isotretinoin Follow Up   iPledge # [id number]    Date 07/14/21    Two Forms of Birth Control Female Condom      Dosage   Target Dosage (mg) 9927    Current (To Date) Dosage (mg) 4800    To Go Dosage (mg) 5127      Side Effects   Skin Chapped Lips    Gastrointestinal WNL    Neurological WNL    Constitutional WNL             Objective  Well appearing patient in no apparent distress; mood and affect are within normal limits.  A focused examination was performed including head and neck. Relevant physical exam findings are noted in the Assessment and Plan.  Head - Anterior (Face) Roughly halfway through her isotretinoin [place] target with excellent clearing of inflammatory acne.  Compliant with I pledge.  Normal mood.  Moderate cheilitis.   Assessment & Plan  Acne vulgaris Head - Anterior (Face)  Continue present treatment.  She knows that she can contact the office if there are any issues.  Pregnancy, urine - Head - Anterior (Face)  ISOtretinoin (ACCUTANE) 40 MG capsule - Head - Anterior (Face) Take 1 capsule (40 mg total) by mouth 2 (two) times daily.  Encounter for therapeutic drug monitoring  Related Procedures Pregnancy, urine  Related Medications ISOtretinoin (ACCUTANE) 40 MG capsule Take 1 capsule (40 mg total) by mouth 2 (two) times daily.

## 2021-07-23 ENCOUNTER — Institutional Professional Consult (permissible substitution): Payer: Medicaid Other | Admitting: Neurology

## 2021-08-07 ENCOUNTER — Telehealth: Payer: Self-pay | Admitting: Physician Assistant

## 2021-08-07 MED ORDER — SILVER SULFADIAZINE 1 % EX CREA
1.0000 | TOPICAL_CREAM | Freq: Every day | CUTANEOUS | 0 refills | Status: AC
Start: 2021-08-07 — End: ?

## 2021-08-07 NOTE — Telephone Encounter (Signed)
I spoke with patient and she stated that she's currently using Vaseline, neosporin, and Aleo. I informed Robyne Askew and she recommended cool milk compress and she will send in Silvadene for the patient. Patient aware.   ?
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
## 2021-08-14 ENCOUNTER — Ambulatory Visit (INDEPENDENT_AMBULATORY_CARE_PROVIDER_SITE_OTHER): Payer: Medicaid Other | Admitting: Physician Assistant

## 2021-08-14 ENCOUNTER — Other Ambulatory Visit: Payer: Self-pay

## 2021-08-14 ENCOUNTER — Encounter: Payer: Self-pay | Admitting: Physician Assistant

## 2021-08-14 DIAGNOSIS — L03011 Cellulitis of right finger: Secondary | ICD-10-CM | POA: Diagnosis not present

## 2021-08-14 DIAGNOSIS — L700 Acne vulgaris: Secondary | ICD-10-CM | POA: Diagnosis not present

## 2021-08-14 DIAGNOSIS — Z5181 Encounter for therapeutic drug level monitoring: Secondary | ICD-10-CM | POA: Diagnosis not present

## 2021-08-14 MED ORDER — CLOTRIMAZOLE-BETAMETHASONE 1-0.05 % EX CREA: 1.0000 "application " | TOPICAL_CREAM | Freq: Two times a day (BID) | CUTANEOUS | 1 refills | Status: AC

## 2021-08-14 MED ORDER — ISOTRETINOIN 40 MG PO CAPS: 40.0000 mg | ORAL_CAPSULE | Freq: Two times a day (BID) | ORAL | 0 refills | Status: AC
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
## 2021-08-15 LAB — PREGNANCY, URINE: Preg Test, Ur: NEGATIVE

## 2021-08-19 ENCOUNTER — Telehealth: Payer: Self-pay | Admitting: Physician Assistant

## 2021-08-19 NOTE — Telephone Encounter (Signed)
Patient called back and states that pharmacy was able to fill medication. She said this message can be canceled. ?

## 2021-08-19 NOTE — Telephone Encounter (Signed)
Patient is calling to say that per pharmacy this office has not done our part with ipledge and patient needs to get her prescription. ?ISOtretinoin ?ISOtretinoin (ACCUTANE) 40 MG capsule ? ?[id number] [place], [place] - [address](Ph: [phone] ?

## 2021-09-16 ENCOUNTER — Ambulatory Visit (INDEPENDENT_AMBULATORY_CARE_PROVIDER_SITE_OTHER): Payer: Medicaid Other | Admitting: Dermatology

## 2021-09-16 DIAGNOSIS — L700 Acne vulgaris: Secondary | ICD-10-CM | POA: Diagnosis not present

## 2021-09-16 DIAGNOSIS — L812 Freckles: Secondary | ICD-10-CM

## 2021-09-16 DIAGNOSIS — Z5181 Encounter for therapeutic drug level monitoring: Secondary | ICD-10-CM | POA: Diagnosis not present

## 2021-09-16 MED ORDER — ARAZLO 0.045 % EX LOTN: 1.0000 "application " | TOPICAL_LOTION | Freq: Every evening | CUTANEOUS | 1 refills | Status: AC

## 2021-09-17 LAB — PREGNANCY, URINE: Preg Test, Ur: NEGATIVE

## 2021-10-04 ENCOUNTER — Encounter: Payer: Self-pay | Admitting: Dermatology

## 2021-10-04 NOTE — Progress Notes (Signed)
? ?  Follow-Up Visit ?  ?Subjective  ?Rhonda Bird is a 36 y.o. female who presents for the following: Acne (31 day f/u ). ? ?Acne follow-up, check new freckle on arm ?Location:  ?Duration:  ?Quality:  ?Associated Signs/Symptoms: ?Modifying Factors:  ?Severity:  ?Timing: ?Context:  ? ?Objective  ?Well appearing patient in no apparent distress; mood and affect are within normal limits. ?Head - Anterior (Face) ?Acne has been essentially clear for several months, has reached reasonable total target dose to stop isotretinoin. ? ?Left Popliteal Fossa ?3 mm monochrome symmetric brown macule, no dermoscopic atypia but historically new ? ? ? ? ? ? ?A focused examination was performed including head, neck, and arm. Relevant physical exam findings are noted in the Assessment and Plan. ? ? ?Assessment & Plan  ? ? ?Acne vulgaris ?Head - Anterior (Face) ? ?Per Dr. Denna Haggard no more refill for isotretinoin pt has medication left over so urine test will be done today.  ? ?Pregnancy, urine - Head - Anterior (Face) ? ?Tazarotene (ARAZLO) 0.045 % LOTN - Head - Anterior (Face) ?Apply 1 application. topically at bedtime. ? ?Related Medications ?ISOtretinoin (ACCUTANE) 40 MG capsule ?Take 1 capsule (40 mg total) by mouth 2 (two) times daily. ? ?Encounter for therapeutic drug monitoring ? ?Related Procedures ?Pregnancy, urine ? ?Related Medications ?ISOtretinoin (ACCUTANE) 40 MG capsule ?Take 1 capsule (40 mg total) by mouth 2 (two) times daily. ? ?Freckles ?Left Popliteal Fossa ? ?Return for biopsy if there is clinical change ? ? ? ? ? ?I, Lavonna Monarch, MD, have reviewed all documentation for this visit.  The documentation on 10/04/21 for the exam, diagnosis, procedures, and orders are all accurate and complete. ?

## 2021-10-21 ENCOUNTER — Ambulatory Visit: Payer: Medicaid Other | Admitting: Physician Assistant

## 2021-10-25 ENCOUNTER — Other Ambulatory Visit: Payer: Self-pay | Admitting: Physician Assistant

## 2021-10-25 DIAGNOSIS — L700 Acne vulgaris: Secondary | ICD-10-CM

## 2021-12-03 ENCOUNTER — Ambulatory Visit: Payer: Medicaid Other | Admitting: Physician Assistant

## 2023-03-08 ENCOUNTER — Ambulatory Visit (INDEPENDENT_AMBULATORY_CARE_PROVIDER_SITE_OTHER): Payer: Medicaid Other | Admitting: Audiology

## 2023-03-08 ENCOUNTER — Ambulatory Visit (INDEPENDENT_AMBULATORY_CARE_PROVIDER_SITE_OTHER): Payer: Medicaid Other | Admitting: Otolaryngology

## 2023-03-08 ENCOUNTER — Encounter (INDEPENDENT_AMBULATORY_CARE_PROVIDER_SITE_OTHER): Payer: Self-pay | Admitting: Otolaryngology

## 2023-03-08 VITALS — Ht 66.0 in | Wt 210.0 lb

## 2023-03-08 DIAGNOSIS — H9312 Tinnitus, left ear: Secondary | ICD-10-CM

## 2023-03-08 DIAGNOSIS — H9042 Sensorineural hearing loss, unilateral, left ear, with unrestricted hearing on the contralateral side: Secondary | ICD-10-CM | POA: Diagnosis not present

## 2023-03-09 DIAGNOSIS — H9042 Sensorineural hearing loss, unilateral, left ear, with unrestricted hearing on the contralateral side: Secondary | ICD-10-CM | POA: Insufficient documentation

## 2023-03-09 DIAGNOSIS — H9312 Tinnitus, left ear: Secondary | ICD-10-CM | POA: Insufficient documentation
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
## 2023-03-09 NOTE — Progress Notes (Unsigned)
[facility] [address] [place], [place] [address]   Audiological Evaluation    History: Kehlani Vancamp was referred today for a hearing evaluation by Dr. Janeece Riggers Philomena Doheny.   Otoscopy: Right ear: Clear external ear canals and normal landmarks in the tympanic membrane. Left ear: Clear external ear canals and normal landmarks in the tympanic membrane.   Tympanogram: Right ear: Normal external ear canal volume with normal middle ear pressure and tympanic membrane compliance (Type A). Left ear: Normal external ear canal volume with normal middle ear pressure and tympanic membrane compliance (Type A).   Hearing Evaluation: The audiogram was completed using conventional audiometric techniques under headphones with good reliability.   The hearing test results indicate: Right ear: Normal hearing sensitivity from [phone] Hz. Left ear: Normal hearing sensitivity from [phone] Hz sloping to mild hearing loss at 8000 Hz.  Speech Recognition Thresholds were obtained at 5 dBHL in the right ear and 5 dBHL in the left ear.   Word Recognition Testing was completed with NU-6 word lists at 45 dBHL in the right ear and at 45 dBHL in the left ear and the patient scored 100% in the right ear and 100% in the left ear.   Recommendations: Repeat audiogram when changes are perceived or per MD.   Conley Rolls Samaj Wessells, AUD, CCC-A 03/08/23

## 2023-05-10 ENCOUNTER — Ambulatory Visit (INDEPENDENT_AMBULATORY_CARE_PROVIDER_SITE_OTHER): Payer: Medicaid Other
